# Patient Record
Sex: Female | Born: 1970 | Race: White | Hispanic: No | Marital: Married | Smoking: Never smoker
Health system: Southern US, Community
[De-identification: ages and names within clinical notes are randomized; demographics above are authoritative.]

## PROBLEM LIST (undated history)

## (undated) DIAGNOSIS — I471 Supraventricular tachycardia, unspecified: Secondary | ICD-10-CM

## (undated) DIAGNOSIS — R0602 Shortness of breath: Secondary | ICD-10-CM

## (undated) DIAGNOSIS — E669 Obesity, unspecified: Secondary | ICD-10-CM

## (undated) DIAGNOSIS — F32A Depression, unspecified: Secondary | ICD-10-CM

## (undated) DIAGNOSIS — R000 Tachycardia, unspecified: Secondary | ICD-10-CM

## (undated) DIAGNOSIS — E559 Vitamin D deficiency, unspecified: Secondary | ICD-10-CM

## (undated) DIAGNOSIS — R42 Dizziness and giddiness: Secondary | ICD-10-CM

## (undated) DIAGNOSIS — R002 Palpitations: Secondary | ICD-10-CM

## (undated) DIAGNOSIS — F329 Major depressive disorder, single episode, unspecified: Secondary | ICD-10-CM

## (undated) DIAGNOSIS — Z9889 Other specified postprocedural states: Secondary | ICD-10-CM

## (undated) DIAGNOSIS — C8590 Non-Hodgkin lymphoma, unspecified, unspecified site: Secondary | ICD-10-CM

## (undated) DIAGNOSIS — J45909 Unspecified asthma, uncomplicated: Secondary | ICD-10-CM

## (undated) DIAGNOSIS — R112 Nausea with vomiting, unspecified: Secondary | ICD-10-CM

## (undated) DIAGNOSIS — D649 Anemia, unspecified: Secondary | ICD-10-CM

## (undated) DIAGNOSIS — I456 Pre-excitation syndrome: Secondary | ICD-10-CM

## (undated) HISTORY — DX: Supraventricular tachycardia: I47.1

## (undated) HISTORY — DX: Tachycardia, unspecified: R00.0

## (undated) HISTORY — DX: Vitamin D deficiency, unspecified: E55.9

## (undated) HISTORY — DX: Palpitations: R00.2

## (undated) HISTORY — DX: Supraventricular tachycardia, unspecified: I47.10

## (undated) HISTORY — DX: Non-Hodgkin lymphoma, unspecified, unspecified site: C85.90

## (undated) HISTORY — DX: Major depressive disorder, single episode, unspecified: F32.9

## (undated) HISTORY — PX: TUMOR REMOVAL: SHX12

## (undated) HISTORY — DX: Dizziness and giddiness: R42

## (undated) HISTORY — DX: Obesity, unspecified: E66.9

## (undated) HISTORY — DX: Shortness of breath: R06.02

## (undated) HISTORY — DX: Depression, unspecified: F32.A

---

## 2008-06-23 ENCOUNTER — Encounter: Admission: RE | Admit: 2008-06-23 | Discharge: 2008-06-23 | Payer: Self-pay | Admitting: Obstetrics and Gynecology

## 2009-07-14 ENCOUNTER — Encounter: Admission: RE | Admit: 2009-07-14 | Discharge: 2009-07-14 | Payer: Self-pay | Admitting: Obstetrics and Gynecology

## 2012-12-18 ENCOUNTER — Other Ambulatory Visit: Payer: Self-pay

## 2012-12-18 DIAGNOSIS — Z1231 Encounter for screening mammogram for malignant neoplasm of breast: Secondary | ICD-10-CM

## 2013-01-14 ENCOUNTER — Ambulatory Visit
Admission: RE | Admit: 2013-01-14 | Discharge: 2013-01-14 | Disposition: A | Discharge status: Home / Self Care | Payer: Commercial Managed Care - PPO | Source: Ambulatory Visit

## 2013-01-14 DIAGNOSIS — Z1231 Encounter for screening mammogram for malignant neoplasm of breast: Secondary | ICD-10-CM

## 2013-12-11 ENCOUNTER — Other Ambulatory Visit: Payer: Self-pay

## 2013-12-11 DIAGNOSIS — Z1231 Encounter for screening mammogram for malignant neoplasm of breast: Secondary | ICD-10-CM

## 2014-01-15 DIAGNOSIS — J45909 Unspecified asthma, uncomplicated: Secondary | ICD-10-CM | POA: Insufficient documentation

## 2014-01-15 DIAGNOSIS — E559 Vitamin D deficiency, unspecified: Secondary | ICD-10-CM | POA: Insufficient documentation

## 2014-01-16 ENCOUNTER — Encounter (INDEPENDENT_AMBULATORY_CARE_PROVIDER_SITE_OTHER): Payer: Self-pay

## 2014-01-16 ENCOUNTER — Ambulatory Visit
Admission: RE | Admit: 2014-01-16 | Discharge: 2014-01-16 | Disposition: A | Discharge status: Home / Self Care | Payer: Commercial Managed Care - PPO | Source: Ambulatory Visit

## 2014-01-16 DIAGNOSIS — Z1231 Encounter for screening mammogram for malignant neoplasm of breast: Secondary | ICD-10-CM

## 2016-01-07 DIAGNOSIS — F4321 Adjustment disorder with depressed mood: Secondary | ICD-10-CM | POA: Insufficient documentation

## 2017-05-02 DIAGNOSIS — F419 Anxiety disorder, unspecified: Secondary | ICD-10-CM | POA: Insufficient documentation

## 2019-08-16 ENCOUNTER — Ambulatory Visit: Payer: Self-pay | Attending: Internal Medicine

## 2019-08-16 DIAGNOSIS — Z23 Encounter for immunization: Secondary | ICD-10-CM | POA: Insufficient documentation

## 2019-08-16 NOTE — Progress Notes (Signed)
   Covid-19 Vaccination Clinic  Name:  Lindsay Booth    MRN: [id number] DOB: May 09, 1971  08/16/2019  [name] was observed post Covid-19 immunization for 15 minutes without incidence. She was provided with Vaccine Information Sheet and instruction to access the V-Safe system.   [name] was instructed to call 911 with any severe reactions post vaccine: Marland Kitchen Difficulty breathing  . Swelling of your face and throat  . A fast heartbeat  . A bad rash all over your body  . Dizziness and weakness    Immunizations Administered    Name Date Dose VIS Date Route   Pfizer COVID-19 Vaccine 08/16/2019  5:58 PM 0.3 mL 05/30/2019 Intramuscular   Manufacturer: [employer], [employer]   Lot: [id number]   NDC: [address]-2

## 2019-09-06 ENCOUNTER — Ambulatory Visit: Payer: Self-pay | Attending: Internal Medicine

## 2019-09-06 DIAGNOSIS — Z23 Encounter for immunization: Secondary | ICD-10-CM

## 2019-09-06 NOTE — Progress Notes (Signed)
   Covid-19 Vaccination Clinic  Name:  Lindsay Booth    MRN: [id number] DOB: Feb 24, 1971  09/06/2019  [name] was observed post Covid-19 immunization for 15 minutes without incident. She was provided with Vaccine Information Sheet and instruction to access the V-Safe system.   [name] was instructed to call 911 with any severe reactions post vaccine: Marland Kitchen Difficulty breathing  . Swelling of face and throat  . A fast heartbeat  . A bad rash all over body  . Dizziness and weakness   Immunizations Administered    Name Date Dose VIS Date Route   Pfizer COVID-19 Vaccine 09/06/2019  2:09 PM 0.3 mL 05/30/2019 Intramuscular   Manufacturer: [employer], [employer]   Lot: [id number]   NDC: [address]-1

## 2020-03-30 ENCOUNTER — Other Ambulatory Visit: Payer: Self-pay | Admitting: Obstetrics and Gynecology

## 2020-03-30 DIAGNOSIS — R928 Other abnormal and inconclusive findings on diagnostic imaging of breast: Secondary | ICD-10-CM

## 2020-04-13 ENCOUNTER — Other Ambulatory Visit: Payer: Self-pay

## 2020-04-13 ENCOUNTER — Ambulatory Visit
Admission: RE | Admit: 2020-04-13 | Discharge: 2020-04-13 | Disposition: A | Discharge status: Home / Self Care | Payer: Commercial Managed Care - PPO | Source: Ambulatory Visit | Attending: Obstetrics and Gynecology | Admitting: Obstetrics and Gynecology

## 2020-04-13 ENCOUNTER — Other Ambulatory Visit: Payer: Self-pay | Admitting: Obstetrics and Gynecology

## 2020-04-13 DIAGNOSIS — R928 Other abnormal and inconclusive findings on diagnostic imaging of breast: Secondary | ICD-10-CM

## 2020-04-13 DIAGNOSIS — R921 Mammographic calcification found on diagnostic imaging of breast: Secondary | ICD-10-CM

## 2020-04-19 ENCOUNTER — Ambulatory Visit
Admission: RE | Admit: 2020-04-19 | Discharge: 2020-04-19 | Disposition: A | Discharge status: Home / Self Care | Payer: Commercial Managed Care - PPO | Source: Ambulatory Visit | Attending: Obstetrics and Gynecology | Admitting: Obstetrics and Gynecology

## 2020-04-19 ENCOUNTER — Other Ambulatory Visit: Payer: Self-pay

## 2020-04-19 DIAGNOSIS — R921 Mammographic calcification found on diagnostic imaging of breast: Secondary | ICD-10-CM

## 2020-04-19 HISTORY — PX: BREAST BIOPSY: SHX20

## 2020-04-26 ENCOUNTER — Ambulatory Visit: Payer: Self-pay | Admitting: Surgery

## 2020-04-26 DIAGNOSIS — N6091 Unspecified benign mammary dysplasia of right breast: Secondary | ICD-10-CM

## 2020-04-26 NOTE — H&P (Signed)
Lindsay Booth Appointment: 04/26/2020 2:20 PM [facility] Patient #: [id number] DOB: October 14, 1970 Unknown / Language: [place] / Race: White Female  History of Present Illness Lindsay Fus A. Lorenda Grecco MD; 04/26/2020 3:35 PM) Patient words: 49 year old female recalled from screening mammogram dated 03/22/2020 for right breast calcifications. Patient denies history of breast pain, breast mass or nipple discharge. She relates 3 first-degree relatives with breast cancer.  EXAM: DIGITAL DIAGNOSTIC RIGHT MAMMOGRAM WITH CAD  COMPARISON: Previous exam(s).  ACR Breast Density Category c: The breast tissue is heterogeneously dense, which may obscure small masses.  FINDINGS: Grouped, pleomorphic calcifications are demonstrated in the upper outer quadrant of the right breast at posterior depth. They measure 2.3 x 2.3 x 0.9 cm. They have an indeterminate morphology.  Mammographic images were processed with CAD.  IMPRESSION: Indeterminate right breast calcifications.  RECOMMENDATION: Stereotactic biopsy of the right breast.  I have discussed the findings and recommendations with the patient. If applicable, a reminder letter will be sent to the patient regarding the next appointment.  BI-RADS CATEGORY 4: Suspicious.   Electronically Signed By: Sande Brothers M.D. On: 04/13/2020 14:21       Diagnosis Breast, right, needle core biopsy, right upper outer - ATYPICAL LOBULAR HYPERPLASIA. 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  The patient is a 49 year old female.   Past Surgical History Micheal Likens, CMA; 04/26/2020 2:27 PM) Cesarean Section - Multiple  Diagnostic Studies History (Chanel Lonni Fix, CMA; 04/26/2020 2:27 PM) Colonoscopy never Mammogram within last year Pap Smear 1-5 years ago  Allergies Hedy Camara Lonni Fix, CMA; 04/26/2020 2:27 PM) No Known Drug Allergies [04/26/2020]: Allergies  Reconciled  Medication History (Chanel Lonni Fix, CMA; 04/26/2020 2:28 PM) Wellbutrin (Oral) Specific strength unknown - Active. Doxycycline Hyclate (Oral) Specific strength unknown - Active. Medications Reconciled  Social History Micheal Likens, CMA; 04/26/2020 2:27 PM) Alcohol use Occasional alcohol use. Caffeine use Coffee. No drug use Tobacco use Never smoker.  Family History Micheal Likens, CMA; 04/26/2020 2:27 PM) Breast Cancer Family Members In General. Depression Mother, Sister.  Pregnancy / Birth History Micheal Likens, CMA; 04/26/2020 2:27 PM) Age at menarche 15 years. Gravida 2 Length (months) of breastfeeding 3-6 Maternal age 72-30 Para 2 Regular periods  Other Problems (Chanel Lonni Fix, CMA; 04/26/2020 2:27 PM) Lump In Breast     Review of Systems (Chanel Nolan CMA; 04/26/2020 2:27 PM) General Not Present- Appetite Loss, Chills, Fatigue, Fever, Night Sweats, Weight Gain and Weight Loss. Skin Not Present- Change in Wart/Mole, Dryness, Hives, Jaundice, New Lesions, Non-Healing Wounds, Rash and Ulcer. HEENT Present- Seasonal Allergies. Not Present- Earache, Hearing Loss, Hoarseness, Nose Bleed, Oral Ulcers, Ringing in the Ears, Sinus Pain, Sore Throat, Visual Disturbances, Wears glasses/contact lenses and Yellow Eyes. Respiratory Not Present- Bloody sputum, Chronic Cough, Difficulty Breathing, Snoring and Wheezing. Breast Present- Breast Mass. Not Present- Breast Pain, Nipple Discharge and Skin Changes. Cardiovascular Not Present- Chest Pain, Difficulty Breathing Lying Down, Leg Cramps, Palpitations, Rapid Heart Rate, Shortness of Breath and Swelling of Extremities. Gastrointestinal Not Present- Abdominal Pain, Bloating, Bloody Stool, Change in Bowel Habits, Chronic diarrhea, Constipation, Difficulty Swallowing, Excessive gas, Gets full quickly at meals, Hemorrhoids, Indigestion, Nausea, Rectal Pain and Vomiting. Musculoskeletal Not Present- Back Pain, Joint Pain,  Joint Stiffness, Muscle Pain, Muscle Weakness and Swelling of Extremities. Neurological Not Present- Decreased Memory, Fainting, Headaches, Numbness, Seizures, Tingling, Tremor, Trouble walking and Weakness. Psychiatric Present- Depression. Not Present- Anxiety, Bipolar, Change in Sleep Pattern, Fearful and Frequent crying. Endocrine Not Present- Cold Intolerance, Excessive  Hunger, Hair Changes, Heat Intolerance, Hot flashes and New Diabetes. Hematology Not Present- Blood Thinners, Easy Bruising, Excessive bleeding, Gland problems, HIV and Persistent Infections.  Vitals (Chanel Nolan CMA; 04/26/2020 2:28 PM) 04/26/2020 2:28 PM Weight: 158.25 lb Height: 65.5in Body Surface Area: 1.8 m Body Mass Index: 25.93 kg/m  Temp.: 97.57F  Pulse: 71 (Regular)  BP: 124/76(Sitting, Left Arm, Standard)        Physical Exam (Larnie Heart A. Marnie Fazzino MD; 04/26/2020 3:35 PM)  General Mental Status-Alert. General Appearance-Consistent with stated age. Hydration-Well hydrated. Voice-Normal.  Head and Neck Head-normocephalic, atraumatic with no lesions or palpable masses. Trachea-midline. Thyroid Gland Characteristics - normal size and consistency.  Eye Eyeball - Bilateral-Extraocular movements intact. Sclera/Conjunctiva - Bilateral-No scleral icterus.  Chest and Lung Exam Chest and lung exam reveals -quiet, even and easy respiratory effort with no use of accessory muscles and on auscultation, normal breath sounds, no adventitious sounds and normal vocal resonance. Inspection Chest Wall - Normal. Back - normal.  Breast Breast - Left-Symmetric, Non Tender, No Biopsy scars, no Dimpling - Left, No Inflammation, No Lumpectomy scars, No Mastectomy scars, No Peau d' Orange. Breast - Right-Symmetric, Non Tender, No Biopsy scars, no Dimpling - Right, No Inflammation, No Lumpectomy scars, No Mastectomy scars, No Peau d' Orange. Breast Lump-No Palpable Breast  Mass.  Cardiovascular Cardiovascular examination reveals -normal heart sounds, regular rate and rhythm with no murmurs and normal pedal pulses bilaterally.  Abdomen Inspection Inspection of the abdomen reveals - No Hernias. Skin - Scar - no surgical scars. Palpation/Percussion Palpation and Percussion of the abdomen reveal - Soft, Non Tender, No Rebound tenderness, No Rigidity (guarding) and No hepatosplenomegaly. Auscultation Auscultation of the abdomen reveals - Bowel sounds normal.  Neurologic Neurologic evaluation reveals -alert and oriented x 3 with no impairment of recent or remote memory. Mental Status-Normal.  Musculoskeletal Normal Exam - Left-Upper Extremity Strength Normal and Lower Extremity Strength Normal. Normal Exam - Right-Upper Extremity Strength Normal and Lower Extremity Strength Normal.  Lymphatic Head & Neck  General Head & Neck Lymphatics: Bilateral - Description - Normal. Axillary  General Axillary Region: Bilateral - Description - Normal. Tenderness - Non Tender. Femoral & Inguinal  Generalized Femoral & Inguinal Lymphatics: Bilateral - Description - Normal. Tenderness - Non Tender.    Assessment & Plan (Cougar Imel A. 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 Pt agrees to proceed. localized lumpectomy due to potential upgrade risk 20%. Also discussed high risk management strategies well.  total time 45 minutes discussing treatment options, reviewing chart, examination, review pathology, surgical plan and documentation  Current Plans Pt Education - [facility] Free Text Education/Instructions: discussed with patient and provided information. Pt Education - [facility] Breast Biopsy HCI: discussed with  patient and provided information. You are being scheduled for surgery- Our schedulers will call you.  You should hear from our office's scheduling department within 5 working days about the location, date, and time of surgery. We try to make accommodations for patient's preferences in scheduling surgery, but sometimes the OR schedule or the surgeon's schedule prevents [place] from making those accommodations.  If you have not heard from our office [phone]) in 5 working days, call the office and ask for your surgeon's nurse.  If you have other questions about your diagnosis, plan, or surgery, call  the office and ask for your surgeon's nurse.

## 2020-05-18 ENCOUNTER — Other Ambulatory Visit: Payer: Self-pay | Admitting: Surgery

## 2020-05-18 DIAGNOSIS — N6091 Unspecified benign mammary dysplasia of right breast: Secondary | ICD-10-CM

## 2020-07-09 ENCOUNTER — Other Ambulatory Visit: Payer: Self-pay

## 2020-07-09 ENCOUNTER — Encounter (HOSPITAL_BASED_OUTPATIENT_CLINIC_OR_DEPARTMENT_OTHER): Payer: Self-pay | Admitting: Surgery

## 2020-07-09 NOTE — Progress Notes (Signed)
Chart reviewed with Dr. Mal Amabile, anesthesiologist, including history of Wolff-Parkinson-White syndrome. Okay to proceed with surgery as planned.

## 2020-07-12 ENCOUNTER — Other Ambulatory Visit (HOSPITAL_COMMUNITY)
Admission: RE | Admit: 2020-07-12 | Discharge: 2020-07-12 | Disposition: A | Discharge status: Home / Self Care | Payer: Commercial Managed Care - PPO | Source: Ambulatory Visit | Attending: Surgery | Admitting: Surgery

## 2020-07-12 ENCOUNTER — Encounter (HOSPITAL_BASED_OUTPATIENT_CLINIC_OR_DEPARTMENT_OTHER)
Admission: RE | Admit: 2020-07-12 | Discharge: 2020-07-12 | Disposition: A | Discharge status: Home / Self Care | Payer: Commercial Managed Care - PPO | Source: Ambulatory Visit | Attending: Surgery | Admitting: Surgery

## 2020-07-12 DIAGNOSIS — Z01812 Encounter for preprocedural laboratory examination: Secondary | ICD-10-CM | POA: Insufficient documentation

## 2020-07-12 DIAGNOSIS — Z20822 Contact with and (suspected) exposure to covid-19: Secondary | ICD-10-CM | POA: Insufficient documentation

## 2020-07-12 LAB — COMPREHENSIVE METABOLIC PANEL
ALT: 15 U/L (ref 0–44)
AST: 19 U/L (ref 15–41)
Albumin: 3.8 g/dL (ref 3.5–5.0)
Alkaline Phosphatase: 36 U/L — ABNORMAL LOW (ref 38–126)
Anion gap: 10 (ref 5–15)
BUN: 12 mg/dL (ref 6–20)
CO2: 27 mmol/L (ref 22–32)
Calcium: 9.2 mg/dL (ref 8.9–10.3)
Chloride: 101 mmol/L (ref 98–111)
Creatinine, Ser: 0.81 mg/dL (ref 0.44–1.00)
GFR, Estimated: >60 mL/min (ref >60)
Glucose, Bld: 89 mg/dL (ref 70–99)
Potassium: 4.5 mmol/L (ref 3.5–5.1)
Sodium: 138 mmol/L (ref 135–145)
Total Bilirubin: 0.5 mg/dL (ref 0.3–1.2)
Total Protein: 6.6 g/dL (ref 6.5–8.1)

## 2020-07-12 LAB — CBC WITH DIFFERENTIAL/PLATELET
Abs Immature Granulocytes: 0.01 10*3/uL (ref 0.00–0.07)
Basophils Absolute: 0.1 10*3/uL (ref 0.0–0.1)
Basophils Relative: 1 %
Eosinophils Absolute: 0.3 10*3/uL (ref 0.0–0.5)
Eosinophils Relative: 5 %
HCT: 34.3 % — ABNORMAL LOW (ref 36.0–46.0)
Hemoglobin: 11.4 g/dL — ABNORMAL LOW (ref 12.0–15.0)
Immature Granulocytes: 0 %
Lymphocytes Relative: 20 %
Lymphs Abs: 1.2 10*3/uL (ref 0.7–4.0)
MCH: 27.8 pg (ref 26.0–34.0)
MCHC: 33.2 g/dL (ref 30.0–36.0)
MCV: 83.7 fL (ref 80.0–100.0)
Monocytes Absolute: 0.4 10*3/uL (ref 0.1–1.0)
Monocytes Relative: 7 %
Neutro Abs: 4.1 10*3/uL (ref 1.7–7.7)
Neutrophils Relative %: 67 %
Platelets: 296 10*3/uL (ref 150–400)
RBC: 4.1 MIL/uL (ref 3.87–5.11)
RDW: 12.8 % (ref 11.5–15.5)
WBC: 6 10*3/uL (ref 4.0–10.5)
nRBC: 0 % (ref 0.0–0.2)

## 2020-07-12 LAB — SARS CORONAVIRUS 2 (TAT 6-24 HRS): SARS Coronavirus 2: NEGATIVE

## 2020-07-12 NOTE — Progress Notes (Signed)

## 2020-07-14 ENCOUNTER — Other Ambulatory Visit: Payer: Self-pay

## 2020-07-14 ENCOUNTER — Ambulatory Visit
Admission: RE | Admit: 2020-07-14 | Discharge: 2020-07-14 | Disposition: A | Discharge status: Home / Self Care | Payer: Commercial Managed Care - PPO | Source: Ambulatory Visit | Attending: Surgery | Admitting: Surgery

## 2020-07-14 DIAGNOSIS — N6091 Unspecified benign mammary dysplasia of right breast: Secondary | ICD-10-CM

## 2020-07-15 ENCOUNTER — Ambulatory Visit
Admission: RE | Admit: 2020-07-15 | Discharge: 2020-07-15 | Disposition: A | Discharge status: Home / Self Care | Payer: Commercial Managed Care - PPO | Source: Ambulatory Visit | Attending: Surgery | Admitting: Surgery

## 2020-07-15 ENCOUNTER — Ambulatory Visit (HOSPITAL_BASED_OUTPATIENT_CLINIC_OR_DEPARTMENT_OTHER): Payer: Commercial Managed Care - PPO | Admitting: Anesthesiology

## 2020-07-15 ENCOUNTER — Ambulatory Visit (HOSPITAL_BASED_OUTPATIENT_CLINIC_OR_DEPARTMENT_OTHER)
Admission: RE | Admit: 2020-07-15 | Discharge: 2020-07-15 | Disposition: A | Discharge status: Home / Self Care | Payer: Commercial Managed Care - PPO | Source: Ambulatory Visit | Attending: Surgery | Admitting: Surgery

## 2020-07-15 ENCOUNTER — Encounter (HOSPITAL_BASED_OUTPATIENT_CLINIC_OR_DEPARTMENT_OTHER)
Admission: RE | Disposition: A | Discharge status: Home / Self Care | Payer: Self-pay | Source: Ambulatory Visit | Attending: Surgery

## 2020-07-15 ENCOUNTER — Encounter (HOSPITAL_BASED_OUTPATIENT_CLINIC_OR_DEPARTMENT_OTHER): Payer: Self-pay | Admitting: Surgery

## 2020-07-15 DIAGNOSIS — N6489 Other specified disorders of breast: Secondary | ICD-10-CM | POA: Insufficient documentation

## 2020-07-15 DIAGNOSIS — Z803 Family history of malignant neoplasm of breast: Secondary | ICD-10-CM | POA: Diagnosis not present

## 2020-07-15 DIAGNOSIS — N6091 Unspecified benign mammary dysplasia of right breast: Secondary | ICD-10-CM | POA: Diagnosis present

## 2020-07-15 DIAGNOSIS — Z79899 Other long term (current) drug therapy: Secondary | ICD-10-CM | POA: Diagnosis not present

## 2020-07-15 HISTORY — DX: Other specified postprocedural states: Z98.890

## 2020-07-15 HISTORY — DX: Anemia, unspecified: D64.9

## 2020-07-15 HISTORY — DX: Unspecified asthma, uncomplicated: J45.909

## 2020-07-15 HISTORY — PX: BREAST LUMPECTOMY WITH RADIOACTIVE SEED LOCALIZATION: SHX6424

## 2020-07-15 HISTORY — DX: Pre-excitation syndrome: I45.6

## 2020-07-15 HISTORY — DX: Nausea with vomiting, unspecified: R11.2

## 2020-07-15 HISTORY — PX: BREAST EXCISIONAL BIOPSY: SUR124

## 2020-07-15 LAB — POCT PREGNANCY, URINE: Preg Test, Ur: NEGATIVE

## 2020-07-15 SURGERY — BREAST LUMPECTOMY WITH RADIOACTIVE SEED LOCALIZATION
Anesthesia: General | Site: Breast | Laterality: Right

## 2020-07-15 MED ORDER — CEFAZOLIN SODIUM-DEXTROSE 2-4 GM/100ML-% IV SOLN
INTRAVENOUS | Status: AC
  Filled 2020-07-15: qty 100

## 2020-07-15 MED ORDER — CELECOXIB 200 MG PO CAPS
ORAL_CAPSULE | ORAL | Status: AC
  Filled 2020-07-15: qty 1

## 2020-07-15 MED ORDER — PROPOFOL 500 MG/50ML IV EMUL
INTRAVENOUS | Status: AC
  Filled 2020-07-15: qty 50

## 2020-07-15 MED ORDER — FENTANYL CITRATE (PF) 100 MCG/2ML IJ SOLN
INTRAMUSCULAR | Status: DC | PRN
  Administered 2020-07-15: 50 ug via INTRAVENOUS

## 2020-07-15 MED ORDER — MEPERIDINE HCL 25 MG/ML IJ SOLN: 6.2500 mg | INTRAMUSCULAR | Status: DC | PRN

## 2020-07-15 MED ORDER — DROPERIDOL 2.5 MG/ML IJ SOLN
INTRAMUSCULAR | Status: AC
  Filled 2020-07-15: qty 2

## 2020-07-15 MED ORDER — CHLORHEXIDINE GLUCONATE CLOTH 2 % EX PADS: 6.0000 | MEDICATED_PAD | Freq: Once | CUTANEOUS | Status: DC

## 2020-07-15 MED ORDER — CEFAZOLIN SODIUM-DEXTROSE 2-4 GM/100ML-% IV SOLN: 2.0000 g | INTRAVENOUS | Status: DC

## 2020-07-15 MED ORDER — PROPOFOL 10 MG/ML IV BOLUS
INTRAVENOUS | Status: DC | PRN
  Administered 2020-07-15: 200 mg via INTRAVENOUS

## 2020-07-15 MED ORDER — BUPIVACAINE HCL (PF) 0.25 % IJ SOLN
INTRAMUSCULAR | Status: AC
  Filled 2020-07-15: qty 120

## 2020-07-15 MED ORDER — DEXAMETHASONE SODIUM PHOSPHATE 4 MG/ML IJ SOLN
INTRAMUSCULAR | Status: DC | PRN
  Administered 2020-07-15: 10 mg via INTRAVENOUS

## 2020-07-15 MED ORDER — HYDROCODONE-ACETAMINOPHEN 5-325 MG PO TABS: 1.0000 | ORAL_TABLET | Freq: Four times a day (QID) | ORAL | 0 refills | Status: DC | PRN

## 2020-07-15 MED ORDER — DEXAMETHASONE SODIUM PHOSPHATE 10 MG/ML IJ SOLN
INTRAMUSCULAR | Status: AC
  Filled 2020-07-15: qty 1

## 2020-07-15 MED ORDER — PROMETHAZINE HCL 25 MG/ML IJ SOLN: 6.2500 mg | INTRAMUSCULAR | Status: DC | PRN

## 2020-07-15 MED ORDER — ACETAMINOPHEN 500 MG PO TABS
ORAL_TABLET | ORAL | Status: AC
  Filled 2020-07-15: qty 2

## 2020-07-15 MED ORDER — DROPERIDOL 2.5 MG/ML IJ SOLN
INTRAMUSCULAR | Status: DC | PRN
  Administered 2020-07-15: .625 mg via INTRAVENOUS

## 2020-07-15 MED ORDER — ONDANSETRON HCL 4 MG/2ML IJ SOLN
INTRAMUSCULAR | Status: DC | PRN
  Administered 2020-07-15: 4 mg via INTRAVENOUS

## 2020-07-15 MED ORDER — BUPIVACAINE HCL 0.25 % IJ SOLN
INTRAMUSCULAR | Status: DC | PRN
  Administered 2020-07-15: 16 mL

## 2020-07-15 MED ORDER — LACTATED RINGERS IV SOLN: INTRAVENOUS | Status: DC

## 2020-07-15 MED ORDER — GABAPENTIN 300 MG PO CAPS
ORAL_CAPSULE | ORAL | Status: AC
  Filled 2020-07-15: qty 1

## 2020-07-15 MED ORDER — LIDOCAINE HCL (CARDIAC) PF 100 MG/5ML IV SOSY
PREFILLED_SYRINGE | INTRAVENOUS | Status: DC | PRN
  Administered 2020-07-15: 60 mg via INTRAVENOUS

## 2020-07-15 MED ORDER — ONDANSETRON HCL 4 MG/2ML IJ SOLN
INTRAMUSCULAR | Status: AC
  Filled 2020-07-15: qty 2

## 2020-07-15 MED ORDER — OXYCODONE HCL 5 MG PO TABS: 5.0000 mg | ORAL_TABLET | Freq: Once | ORAL | Status: DC | PRN

## 2020-07-15 MED ORDER — MIDAZOLAM HCL 2 MG/2ML IJ SOLN
INTRAMUSCULAR | Status: AC
  Filled 2020-07-15: qty 2

## 2020-07-15 MED ORDER — LIDOCAINE 2% (20 MG/ML) 5 ML SYRINGE
INTRAMUSCULAR | Status: AC
  Filled 2020-07-15: qty 5

## 2020-07-15 MED ORDER — OXYCODONE HCL 5 MG/5ML PO SOLN: 5.0000 mg | Freq: Once | ORAL | Status: DC | PRN

## 2020-07-15 MED ORDER — MIDAZOLAM HCL 5 MG/5ML IJ SOLN
INTRAMUSCULAR | Status: DC | PRN
  Administered 2020-07-15: 2 mg via INTRAVENOUS

## 2020-07-15 MED ORDER — CELECOXIB 200 MG PO CAPS
200.0000 mg | ORAL_CAPSULE | ORAL | Status: AC
  Administered 2020-07-15: 200 mg via ORAL

## 2020-07-15 MED ORDER — 0.9 % SODIUM CHLORIDE (POUR BTL) OPTIME
TOPICAL | Status: DC | PRN
  Administered 2020-07-15: 1000 mL

## 2020-07-15 MED ORDER — HYDROMORPHONE HCL 1 MG/ML IJ SOLN: 0.2500 mg | INTRAMUSCULAR | Status: DC | PRN

## 2020-07-15 MED ORDER — FENTANYL CITRATE (PF) 100 MCG/2ML IJ SOLN
INTRAMUSCULAR | Status: AC
  Filled 2020-07-15: qty 2

## 2020-07-15 MED ORDER — ACETAMINOPHEN 500 MG PO TABS
1000.0000 mg | ORAL_TABLET | ORAL | Status: AC
  Administered 2020-07-15: 1000 mg via ORAL

## 2020-07-15 MED ORDER — IBUPROFEN 800 MG PO TABS: 800.0000 mg | ORAL_TABLET | Freq: Three times a day (TID) | ORAL | 0 refills | Status: AC | PRN

## 2020-07-15 MED ORDER — GABAPENTIN 300 MG PO CAPS
300.0000 mg | ORAL_CAPSULE | ORAL | Status: AC
  Administered 2020-07-15: 300 mg via ORAL

## 2020-07-15 SURGICAL SUPPLY — 51 items
ADH SKN CLS APL DERMABOND .7 (GAUZE/BANDAGES/DRESSINGS) ×1
APL PRP STRL LF DISP 70% ISPRP (MISCELLANEOUS) ×1
APPLIER CLIP 9.375 MED OPEN (MISCELLANEOUS)
APR CLP MED 9.3 20 MLT OPN (MISCELLANEOUS)
BINDER BREAST LRG (GAUZE/BANDAGES/DRESSINGS) ×2 IMPLANT
BINDER BREAST MEDIUM (GAUZE/BANDAGES/DRESSINGS) IMPLANT
BINDER BREAST XLRG (GAUZE/BANDAGES/DRESSINGS) IMPLANT
BINDER BREAST XXLRG (GAUZE/BANDAGES/DRESSINGS) IMPLANT
BLADE SURG 15 STRL LF DISP TIS (BLADE) ×1 IMPLANT
BLADE SURG 15 STRL SS (BLADE) ×2
CANISTER SUC SOCK COL 7IN (MISCELLANEOUS) IMPLANT
CANISTER SUCT 1200ML W/VALVE (MISCELLANEOUS) IMPLANT
CHLORAPREP W/TINT 26 (MISCELLANEOUS) ×2 IMPLANT
CLIP APPLIE 9.375 MED OPEN (MISCELLANEOUS) IMPLANT
COVER BACK TABLE 60X90IN (DRAPES) ×2 IMPLANT
COVER MAYO STAND STRL (DRAPES) ×2 IMPLANT
COVER PROBE W GEL 5X96 (DRAPES) ×2 IMPLANT
COVER WAND RF STERILE (DRAPES) IMPLANT
DECANTER SPIKE VIAL GLASS SM (MISCELLANEOUS) IMPLANT
DERMABOND ADVANCED (GAUZE/BANDAGES/DRESSINGS) ×1
DERMABOND ADVANCED .7 DNX12 (GAUZE/BANDAGES/DRESSINGS) ×1 IMPLANT
DRAPE LAPAROSCOPIC ABDOMINAL (DRAPES) IMPLANT
DRAPE LAPAROTOMY 100X72 PEDS (DRAPES) ×2 IMPLANT
DRAPE UTILITY XL STRL (DRAPES) ×2 IMPLANT
ELECT COATED BLADE 2.86 ST (ELECTRODE) ×2 IMPLANT
ELECT REM PT RETURN 9FT ADLT (ELECTROSURGICAL) ×2
ELECTRODE REM PT RTRN 9FT ADLT (ELECTROSURGICAL) ×1 IMPLANT
GLOVE ECLIPSE 8.0 STRL XLNG CF (GLOVE) ×2 IMPLANT
GLOVE SRG 8 PF TXTR STRL LF DI (GLOVE) ×1 IMPLANT
GLOVE SURG UNDER POLY LF SZ8 (GLOVE) ×2
GOWN STRL REUS W/ TWL LRG LVL3 (GOWN DISPOSABLE) ×2 IMPLANT
GOWN STRL REUS W/ TWL XL LVL3 (GOWN DISPOSABLE) ×1 IMPLANT
GOWN STRL REUS W/TWL LRG LVL3 (GOWN DISPOSABLE) ×4
GOWN STRL REUS W/TWL XL LVL3 (GOWN DISPOSABLE) ×2
HEMOSTAT ARISTA ABSORB 3G PWDR (HEMOSTASIS) IMPLANT
HEMOSTAT SNOW SURGICEL 2X4 (HEMOSTASIS) IMPLANT
KIT MARKER MARGIN INK (KITS) ×2 IMPLANT
NEEDLE HYPO 25X1 1.5 SAFETY (NEEDLE) ×2 IMPLANT
NS IRRIG 1000ML POUR BTL (IV SOLUTION) ×2 IMPLANT
PACK BASIN DAY SURGERY FS (CUSTOM PROCEDURE TRAY) ×2 IMPLANT
PENCIL SMOKE EVACUATOR (MISCELLANEOUS) ×2 IMPLANT
SLEEVE SCD COMPRESS KNEE MED (MISCELLANEOUS) ×2 IMPLANT
SPONGE LAP 4X18 RFD (DISPOSABLE) ×2 IMPLANT
SUT MNCRL AB 4-0 PS2 18 (SUTURE) ×2 IMPLANT
SUT SILK 2 0 SH (SUTURE) IMPLANT
SUT VICRYL 3-0 CR8 SH (SUTURE) ×2 IMPLANT
SYR CONTROL 10ML LL (SYRINGE) ×2 IMPLANT
TOWEL GREEN STERILE FF (TOWEL DISPOSABLE) ×2 IMPLANT
TRAY FAXITRON CT DISP (TRAY / TRAY PROCEDURE) ×2 IMPLANT
TUBE CONNECTING 20X1/4 (TUBING) IMPLANT
YANKAUER SUCT BULB TIP NO VENT (SUCTIONS) IMPLANT

## 2020-07-15 NOTE — Anesthesia Postprocedure Evaluation (Signed)
Anesthesia Post Note  Patient: Lindsay Booth  Procedure(s) Performed: RADIOACTIVE SEED GUIDED RIGHT BREAST LUMPECTOMY (Right Breast)     Patient location during evaluation: PACU Anesthesia Type: General Level of consciousness: awake and alert Pain management: pain level controlled Vital Signs Assessment: post-procedure vital signs reviewed and stable Respiratory status: spontaneous breathing, nonlabored ventilation and respiratory function stable Cardiovascular status: blood pressure returned to baseline and stable Postop Assessment: no apparent nausea or vomiting Anesthetic complications: no   No complications documented.  Last Vitals:  Vitals:   07/15/20 0845 07/15/20 0911  BP: 100/61 98/73  Pulse: (!) 58 64  Resp: 11 18  Temp:  36.7 C  SpO2: 100% 100%    Last Pain:  Vitals:   07/15/20 0911  TempSrc:   PainSc: 2                  Lowella Curb

## 2020-07-15 NOTE — Transfer of Care (Signed)
Immediate Anesthesia Transfer of Care Note  Patient: Lindsay Booth  Procedure(s) Performed: RADIOACTIVE SEED GUIDED RIGHT BREAST LUMPECTOMY (Right Breast)  Patient Location: PACU  Anesthesia Type:General  Level of Consciousness: sedated  Airway & Oxygen Therapy: Patient Spontanous Breathing and Patient connected to face mask oxygen  Post-op Assessment: Report given to RN and Post -op Vital signs reviewed and stable  Post vital signs: Reviewed and stable  Last Vitals:  Vitals Value Taken Time  BP 87/55 07/15/20 0815  Temp    Pulse 59 07/15/20 0817  Resp 10 07/15/20 0817  SpO2 100 % 07/15/20 0817  Vitals shown include unvalidated device data.  Last Pain:  Vitals:   07/15/20 0634  TempSrc: Oral  PainSc: 0-No pain         Complications: No complications documented.

## 2020-07-15 NOTE — Interval H&P Note (Signed)
History and Physical Interval Note:  07/15/2020 7:26 AM  Lindsay Booth  has presented today for surgery, with the diagnosis of RIGHT BREAST ATYPICAL LOBULAR HYPERPLASIA.  The various methods of treatment have been discussed with the patient and family. After consideration of risks, benefits and other options for treatment, the patient has consented to  Procedure(s): RADIOACTIVE SEED GUIDED RIGHT BREAST LUMPECTOMY (Right) as a surgical intervention.  The patient's history has been reviewed, patient examined, no change in status, stable for surgery.  I have reviewed the patient's chart and labs.  Questions were answered to the patient's satisfaction.     Artisha Capri A Kaydie Petsch

## 2020-07-15 NOTE — H&P (Signed)
Lindsay Booth  Location: [facility] Patient #: [id number] DOB: 07-08-1970 Unknown / Language: [place] / Race: White Female  History of Present Illness ( 3:35 PM) Patient words: 50 year old female recalled from screening mammogram dated 03/22/2020 for right breast calcifications. Patient denies history of breast pain, breast mass or nipple discharge. She relates 3 first-degree relatives with breast cancer.  EXAM: DIGITAL DIAGNOSTIC RIGHT MAMMOGRAM WITH CAD  COMPARISON: Previous exam(s).  ACR Breast Density Category c: The breast tissue is heterogeneously dense, which may obscure small masses.  FINDINGS: Grouped, pleomorphic calcifications are demonstrated in the upper outer quadrant of the right breast at posterior depth. They measure 2.3 x 2.3 x 0.9 cm. They have an indeterminate morphology.  Mammographic images were processed with CAD.  IMPRESSION: Indeterminate right breast calcifications.  RECOMMENDATION: Stereotactic biopsy of the right breast.  I have discussed the findings and recommendations with the patient. If applicable, a reminder letter will be sent to the patient regarding the next appointment.  BI-RADS CATEGORY 4: Suspicious.   Electronically Signed By: Sande Brothers M.D. On: 04/13/2020 14:21       Diagnosis Breast, right, needle core biopsy, right upper outer - ATYPICAL LOBULAR HYPERPLASIA. 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  The patient is a 50 year old female.   Past Surgical History  Cesarean Section - Multiple  Diagnostic Studies History PM) Colonoscopy never Mammogram within last year Pap Smear 1-5 years ago  Allergies (No Known Drug Allergies [04/26/2020]: Allergies Reconciled  Medication History ( Wellbutrin (Oral) Specific strength unknown - Active. Doxycycline Hyclate (Oral) Specific strength unknown -  Active. Medications Reconciled  Social History  Alcohol use Occasional alcohol use. Caffeine use Coffee. No drug use Tobacco use Never smoker.  Family History  Breast Cancer Family Members In General. Depression Mother, Sister.  Pregnancy / Birth History ( PM) Age at menarche 15 years. Gravida 2 Length (months) of breastfeeding 3-6 Maternal age 59-30 Para 2 Regular periods  Other Problems) Lump In Breast     Review of Systems (Chanel Nolan CMA; 04/26/2020 2:27 PM) General Not Present- Appetite Loss, Chills, Fatigue, Fever, Night Sweats, Weight Gain and Weight Loss. Skin Not Present- Change in Wart/Mole, Dryness, Hives, Jaundice, New Lesions, Non-Healing Wounds, Rash and Ulcer. HEENT Present- Seasonal Allergies. Not Present- Earache, Hearing Loss, Hoarseness, Nose Bleed, Oral Ulcers, Ringing in the Ears, Sinus Pain, Sore Throat, Visual Disturbances, Wears glasses/contact lenses and Yellow Eyes. Respiratory Not Present- Bloody sputum, Chronic Cough, Difficulty Breathing, Snoring and Wheezing. Breast Present- Breast Mass. Not Present- Breast Pain, Nipple Discharge and Skin Changes. Cardiovascular Not Present- Chest Pain, Difficulty Breathing Lying Down, Leg Cramps, Palpitations, Rapid Heart Rate, Shortness of Breath and Swelling of Extremities. Gastrointestinal Not Present- Abdominal Pain, Bloating, Bloody Stool, Change in Bowel Habits, Chronic diarrhea, Constipation, Difficulty Swallowing, Excessive gas, Gets full quickly at meals, Hemorrhoids, Indigestion, Nausea, Rectal Pain and Vomiting. Musculoskeletal Not Present- Back Pain, Joint Pain, Joint Stiffness, Muscle Pain, Muscle Weakness and Swelling of Extremities. Neurological Not Present- Decreased Memory, Fainting, Headaches, Numbness, Seizures, Tingling, Tremor, Trouble walking and Weakness. Psychiatric Present- Depression. Not Present- Anxiety, Bipolar, Change in Sleep Pattern, Fearful and Frequent  crying. Endocrine Not Present- Cold Intolerance, Excessive Hunger, Hair Changes, Heat Intolerance, Hot flashes and New Diabetes. Hematology Not Present- Blood Thinners, Easy Bruising, Excessive bleeding, Gland problems, HIV and Persistent Infections.  Vitals 04/26/2020 2:28 PM Weight: 158.25 lb Height: 65.5in Body Surface Area: 1.8 m Body Mass Index: 25.93 kg/m  Temp.: 97.81F  Pulse: 71 (Regular)  BP: 124/76(Sitting, Left Arm, Standard)        Physical Exam   General Mental Status-Alert. General Appearance-Consistent with stated age. Hydration-Well hydrated. Voice-Normal.  Head and Neck Head-normocephalic, atraumatic with no lesions or palpable masses. Trachea-midline. Thyroid Gland Characteristics - normal size and consistency.  Eye Eyeball - Bilateral-Extraocular movements intact. Sclera/Conjunctiva - Bilateral-No scleral icterus.  Chest and Lung Exam Chest and lung exam reveals -quiet, even and easy respiratory effort with no use of accessory muscles and on auscultation, normal breath sounds, no adventitious sounds and normal vocal resonance. Inspection Chest Wall - Normal. Back - normal.  Breast Breast - Left-Symmetric, Non Tender, No Biopsy scars, no Dimpling - Left, No Inflammation, No Lumpectomy scars, No Mastectomy scars, No Peau d' Orange. Breast - Right-Symmetric, Non Tender, No Biopsy scars, no Dimpling - Right, No Inflammation, No Lumpectomy scars, No Mastectomy scars, No Peau d' Orange. Breast Lump-No Palpable Breast Mass.  Cardiovascular Cardiovascular examination reveals -normal heart sounds, regular rate and rhythm with no murmurs and normal pedal pulses bilaterally.  Abdomen Inspection Inspection of the abdomen reveals - No Hernias. Skin - Scar - no surgical scars. Palpation/Percussion Palpation and Percussion of the abdomen reveal - Soft, Non Tender, No Rebound tenderness, No Rigidity  (guarding) and No hepatosplenomegaly. Auscultation Auscultation of the abdomen reveals - Bowel sounds normal.  Neurologic Neurologic evaluation reveals -alert and oriented x 3 with no impairment of recent or remote memory. Mental Status-Normal.  Musculoskeletal Normal Exam - Left-Upper Extremity Strength Normal and Lower Extremity Strength Normal. Normal Exam - Right-Upper Extremity Strength Normal and Lower Extremity Strength Normal.  Lymphatic Head & Neck  General Head & Neck Lymphatics: Bilateral - Description - Normal. Axillary  General Axillary Region: Bilateral - Description - Normal. Tenderness - Non Tender. Femoral & Inguinal  Generalized Femoral & Inguinal Lymphatics: Bilateral - Description - Normal. Tenderness - Non Tender.    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 Pt agrees to proceed. localized lumpectomy due to potential upgrade risk 20%. Also discussed high risk management strategies well.  total time 45 minutes discussing treatment options, reviewing chart, examination, review pathology, surgical plan and documentation  Current Plans Pt Education - [facility] Free Text Education/Instructions: discussed with patient and provided information. Pt Education - [facility] Breast Biopsy HCI: discussed with patient and provided information. You are being scheduled for surgery- Our schedulers will call you.  You should hear from our office's scheduling department within 5 working days about the location, date, and time of surgery. We try to make accommodations for patient's preferences in scheduling surgery, but sometimes the OR schedule or the surgeon's schedule prevents [place] from making those  accommodations.  If you have not heard from our office [phone]) in 5 working days, call the office and ask for your surgeon's nurse.  If you have other questions about your diagnosis, plan, or surgery, call the office and ask for your surgeon's nurse.

## 2020-07-15 NOTE — Anesthesia Preprocedure Evaluation (Signed)
Anesthesia Evaluation  Patient identified by MRN, date of birth, ID band Patient awake    Reviewed: Allergy & Precautions, H&P , NPO status , Patient's Chart, lab work & pertinent test results  History of Anesthesia Complications (+) PONV  Airway Mallampati: II  TM Distance: >3 FB Neck ROM: Full    Dental no notable dental hx.    Pulmonary asthma ,    Pulmonary exam normal breath sounds clear to auscultation       Cardiovascular Normal cardiovascular exam Rhythm:Regular Rate:Normal     Neuro/Psych Depression negative neurological ROS  negative psych ROS   GI/Hepatic negative GI ROS, Neg liver ROS,   Endo/Other  negative endocrine ROS  Renal/GU negative Renal ROS  negative genitourinary   Musculoskeletal negative musculoskeletal ROS (+)   Abdominal   Peds negative pediatric ROS (+)  Hematology negative hematology ROS (+)   Anesthesia Other Findings   Reproductive/Obstetrics negative OB ROS                             Anesthesia Physical Anesthesia Plan  ASA: II  Anesthesia Plan: General   Post-op Pain Management:    Induction: Intravenous  PONV Risk Score and Plan: 4 or greater and Ondansetron, Dexamethasone, Midazolam, Droperidol and Treatment may vary due to age or medical condition  Airway Management Planned: LMA  Additional Equipment:   Intra-op Plan:   Post-operative Plan: Extubation in OR  Informed Consent: I have reviewed the patients History and Physical, chart, labs and discussed the procedure including the risks, benefits and alternatives for the proposed anesthesia with the patient or authorized representative who has indicated his/her understanding and acceptance.     Dental advisory given  Plan Discussed with: CRNA  Anesthesia Plan Comments:         Anesthesia Quick Evaluation

## 2020-07-15 NOTE — Anesthesia Procedure Notes (Signed)
Procedure Name: LMA Insertion Date/Time: 07/15/2020 7:35 AM Performed by: Burna Cash, CRNA Pre-anesthesia Checklist: Patient identified, Emergency Drugs available, Suction available and Patient being monitored Patient Re-evaluated:Patient Re-evaluated prior to induction Oxygen Delivery Method: Circle system utilized Preoxygenation: Pre-oxygenation with 100% oxygen Induction Type: IV induction Ventilation: Mask ventilation without difficulty LMA: LMA inserted LMA Size: 4.0 Number of attempts: 1 Airway Equipment and Method: Bite block Placement Confirmation: positive ETCO2 Tube secured with: Tape Dental Injury: Teeth and Oropharynx as per pre-operative assessment

## 2020-07-15 NOTE — Discharge Instructions (Signed)
[facility] °Office Phone Number [phone] ° °BREAST BIOPSY/ PARTIAL MASTECTOMY: POST OP INSTRUCTIONS ° °Always review your discharge instruction sheet given to you by the facility where your surgery was performed. ° °IF YOU HAVE DISABILITY OR FAMILY LEAVE FORMS, YOU MUST BRING THEM TO THE OFFICE FOR PROCESSING.  DO NOT GIVE THEM TO YOUR DOCTOR. ° °1. A prescription for pain medication may be given to you upon discharge.  Take your pain medication as prescribed, if needed.  If narcotic pain medicine is not needed, then you may take acetaminophen (Tylenol) or ibuprofen (Advil) as needed. °2. Take your usually prescribed medications unless otherwise directed °3. If you need a refill on your pain medication, please contact your pharmacy.  They will contact our office to request authorization.  Prescriptions will not be filled after 5pm or on week-ends. °4. You should eat very light the first 24 hours after surgery, such as soup, crackers, pudding, etc.  Resume your normal diet the day after surgery. °5. Most patients will experience some swelling and bruising in the breast.  Ice packs and a good support bra will help.  Swelling and bruising can take several days to resolve.  °6. It is common to experience some constipation if taking pain medication after surgery.  Increasing fluid intake and taking a stool softener will usually help or prevent this problem from occurring.  A mild laxative (Milk of Magnesia or Miralax) should be taken according to package directions if there are no bowel movements after 48 hours. °7. Unless discharge instructions indicate otherwise, you may remove your bandages 24-48 hours after surgery, and you may shower at that time.  You may have steri-strips (small skin tapes) in place directly over the incision.  These strips should be left on the skin for 7-10 days.  If your surgeon used skin glue on the incision, you may shower in 24 hours.  The glue will flake off over the  next 2-3 weeks.  Any sutures or staples will be removed at the office during your follow-up visit. °8. ACTIVITIES:  You may resume regular daily activities (gradually increasing) beginning the next day.  Wearing a good support bra or sports bra minimizes pain and swelling.  You may have sexual intercourse when it is comfortable. °a. You may drive when you no longer are taking prescription pain medication, you can comfortably wear a seatbelt, and you can safely maneuver your car and apply brakes. °b. RETURN TO WORK:  ______________________________________________________________________________________ °9. You should see your doctor in the office for a follow-up appointment approximately two weeks after your surgery.  Your doctor’s nurse will typically make your follow-up appointment when she calls you with your pathology report.  Expect your pathology report 2-3 business days after your surgery.  You may call to check if you do not hear from us after three days. °10. OTHER INSTRUCTIONS: _______________________________________________________________________________________________ _____________________________________________________________________________________________________________________________________ °_____________________________________________________________________________________________________________________________________ °_____________________________________________________________________________________________________________________________________ ° °WHEN TO CALL YOUR DOCTOR: °1. Fever over 101.0 °2. Nausea and/or vomiting. °3. Extreme swelling or bruising. °4. Continued bleeding from incision. °5. Increased pain, redness, or drainage from the incision. ° °The clinic staff is available to answer your questions during regular business hours.  Please don’t hesitate to call and ask to speak to one of the nurses for clinical concerns.  If you have a medical emergency, go to the nearest  emergency room or call 911.  A surgeon from [facility] is always on call at the hospital. ° °For further questions, please visit [email or web address]  ° ° °  NO TYLENOL OR IBUPROFEN BEFORE 12:40PM TODAY, IF NEEDED.  Post Anesthesia Home Care Instructions  Activity: Get plenty of rest for the remainder of the day. A responsible individual must stay with you for 24 hours following the procedure.  For the next 24 hours, DO NOT: -Drive a car -Advertising copywriter -Drink alcoholic beverages -Take any medication unless instructed by your physician -Make any legal decisions or sign important papers.  Meals: Start with liquid foods such as gelatin or soup. Progress to regular foods as tolerated. Avoid greasy, spicy, heavy foods. If nausea and/or vomiting occur, drink only clear liquids until the nausea and/or vomiting subsides. Call your physician if vomiting continues.  Special Instructions/Symptoms: Your throat may feel dry or sore from the anesthesia or the breathing tube placed in your throat during surgery. If this causes discomfort, gargle with warm salt water. The discomfort should disappear within 24 hours.  If you had a scopolamine patch placed behind your ear for the management of post- operative nausea and/or vomiting:  1. The medication in the patch is effective for 72 hours, after which it should be removed.  Wrap patch in a tissue and discard in the trash. Wash hands thoroughly with soap and water. 2. You may remove the patch earlier than 72 hours if you experience unpleasant side effects which may include dry mouth, dizziness or visual disturbances. 3. Avoid touching the patch. Wash your hands with soap and water after contact with the patch.

## 2020-07-15 NOTE — Op Note (Signed)
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  Anesthesia: Gen. With 0.25% Sensorcaine local  EBL: 20 cc  Specimen: right breast  tissue with clip and radioactive seed in the specimen. Verified with neoprobe and radiographic image showing both seed and clip in specimen  Indications for procedure: The patient presents for right breast lumpectomy after core biopsy showed atypical ductal hyperplasia . Discussed the rationale for considering removal. Small risk of malignancy associated with  lesion after core biopsy. Discussed observation. Discussed wire/seed  localization. Patient desired removal .The procedure has been discussed with the patient. Alternatives to surgery have been discussed with the patient.  Risks of surgery include bleeding,  Infection,  Seroma formation, death,  and the need for further surgery.   The patient understands and wishes to proceed.   Description of procedure: Patient underwent seed placement as an outpatient. Patient presents today for right  breast seed localized lumpectomy. Patient seen in the holding area. Questions are answered and neoprobe used to verify seed location. Patient taken back to the operating room and placed upon the OR table. After induction of general anesthesia, right  breast prepped and draped in a sterile fashion. Timeout was done to verify proper  procedure. Neoprobe used and hot spot identified and right  breast upper-outer quadrant. This was marked with pen. Curvilinear incision made right  upper outer quadrant breast. Dissection used with the help of a neoprobe around the tissue where the seed and clip were located. Tissue removed in its entirety with gross negative  margins.. Neoprobe used and seed within specimen. Radiographs taken which show clip and seed  In specimen.Hemostasis achieved and cavity closed with  3-0 Vicryl and 4-0 Monocryl. Dermabond applied. All final counts found to be correct. Specimen transported to pathology. Patient awoke extubated taken to recovery in satisfactory condition.

## 2020-07-16 ENCOUNTER — Encounter (HOSPITAL_BASED_OUTPATIENT_CLINIC_OR_DEPARTMENT_OTHER): Payer: Self-pay | Admitting: Surgery

## 2020-07-19 LAB — SURGICAL PATHOLOGY

## 2020-12-07 DIAGNOSIS — Z8742 Personal history of other diseases of the female genital tract: Secondary | ICD-10-CM | POA: Insufficient documentation

## 2021-01-06 ENCOUNTER — Other Ambulatory Visit: Payer: Self-pay | Admitting: Obstetrics and Gynecology

## 2021-01-06 DIAGNOSIS — Z1231 Encounter for screening mammogram for malignant neoplasm of breast: Secondary | ICD-10-CM

## 2021-03-23 ENCOUNTER — Other Ambulatory Visit: Payer: Self-pay

## 2021-03-23 ENCOUNTER — Ambulatory Visit
Admission: RE | Admit: 2021-03-23 | Discharge: 2021-03-23 | Disposition: A | Discharge status: Home / Self Care | Payer: Commercial Managed Care - PPO | Source: Ambulatory Visit | Attending: Obstetrics and Gynecology | Admitting: Obstetrics and Gynecology

## 2021-03-23 DIAGNOSIS — Z1231 Encounter for screening mammogram for malignant neoplasm of breast: Secondary | ICD-10-CM

## 2021-10-11 ENCOUNTER — Encounter (HOSPITAL_COMMUNITY): Payer: Self-pay

## 2021-12-16 LAB — HM COLONOSCOPY

## 2022-01-06 ENCOUNTER — Other Ambulatory Visit: Payer: Self-pay | Admitting: Obstetrics and Gynecology

## 2022-01-06 DIAGNOSIS — Z1231 Encounter for screening mammogram for malignant neoplasm of breast: Secondary | ICD-10-CM

## 2022-03-24 ENCOUNTER — Ambulatory Visit
Admission: RE | Admit: 2022-03-24 | Discharge: 2022-03-24 | Disposition: A | Discharge status: Home / Self Care | Payer: Commercial Managed Care - PPO | Source: Ambulatory Visit | Attending: Obstetrics and Gynecology | Admitting: Obstetrics and Gynecology

## 2022-03-24 DIAGNOSIS — Z1231 Encounter for screening mammogram for malignant neoplasm of breast: Secondary | ICD-10-CM

## 2022-06-22 ENCOUNTER — Ambulatory Visit
Admission: RE | Admit: 2022-06-22 | Discharge: 2022-06-22 | Disposition: A | Discharge status: Home / Self Care | Payer: Commercial Managed Care - PPO | Source: Ambulatory Visit | Attending: Internal Medicine | Admitting: Internal Medicine

## 2022-06-22 VITALS — BP 108/69 | HR 63 | Temp 98.4°F | Resp 16

## 2022-06-22 DIAGNOSIS — R197 Diarrhea, unspecified: Secondary | ICD-10-CM

## 2022-06-22 NOTE — ED Provider Notes (Signed)
[facility]    CSN: [id number] Arrival date & time: 06/22/22  1513    HISTORY   Chief Complaint  Patient presents with   Bloated   Diarrhea   HPI Lindsay Booth is a pleasant, 52 y.o. female who presents to urgent care today. Patient complains of a 1 week history of diarrhea and bloating.  Patient states she has been taking Pepto-Bismol chews without relief.  Patient states she has had diarrhea in the past and has a history of a sensitive stomach but has never had diarrhea this long.  Patient denies blood in her diarrhea, mucus in her diarrhea, fever, body aches, chills.  Patient states she has had some abdominal cramping but nothing severe.  Patient states her father has a history of diverticulitis, is wondering if that is what might be going on.  Patient states she reached out to her primary care provider who told her to come to urgent care for further evaluation.  The history is provided by the patient.   Past Medical History:  Diagnosis Date   Anemia    resolved   Asthma    Depression    Lightheadedness    Obesity    Palpitations    PONV (postoperative nausea and vomiting)    SOB (shortness of breath)    SVT (supraventricular tachycardia)    Tachycardia    Vitamin D deficiency    Wolff-Parkinson-White (WPW) pattern    There are no problems to display for this patient.  Past Surgical History:  Procedure Laterality Date   BREAST BIOPSY Right 04/19/2020   BREAST EXCISIONAL BIOPSY Right 07/15/2020   PASH   BREAST LUMPECTOMY WITH RADIOACTIVE SEED LOCALIZATION Right 07/15/2020   Procedure: RADIOACTIVE SEED GUIDED RIGHT BREAST LUMPECTOMY;  Surgeon: Erroll Luna, MD;  Location: [facility];  Service: General;  Laterality: Right;   CESAREAN SECTION     TUMOR REMOVAL     fatty tumor removed from L arm   OB History   No obstetric history on file.    Home Medications    Prior to Admission medications   Medication Sig Start Date End Date  Taking? Authorizing Provider  buPROPion (WELLBUTRIN XL) 150 MG 24 hr tablet Take 150 mg by mouth daily.    [provider]  ibuprofen (ADVIL) 800 MG tablet Take 1 tablet (800 mg total) by mouth every 8 (eight) hours as needed. 07/15/20   Cornett, Marcello Moores, MD  loratadine (CLARITIN) 10 MG tablet Take 10 mg by mouth daily.    [provider]  Omega-3 Fatty Acids (FISH OIL PO) Take by mouth.    [provider]  Pseudoeph-CPM-DM-APAP (TYLENOL COLD PO) Take by mouth 2 (two) times daily. Tylenol cold and sinus    [provider]  Vitamin D, Ergocalciferol, (DRISDOL) 50000 UNITS CAPS Take 50,000 Units by mouth.    [provider]    Family History Family History  Problem Relation Age of Onset   Hypertension Father    Breast cancer Paternal Aunt    Breast cancer Paternal Grandmother    Social History Social History   Tobacco Use   Smoking status: Never   Smokeless tobacco: Never  Substance Use Topics   Alcohol use: Yes    Comment: occasionally   Drug use: Never   Allergies   Patient has no known allergies.  Review of Systems Review of Systems Pertinent findings revealed after performing a 14 point review of systems has been noted in the history  of present illness.  Physical Exam Triage Vital Signs ED Triage Vitals  Enc Vitals Group     BP 04/15/21 0827 (!) 147/82     Pulse Rate 04/15/21 0827 72     Resp 04/15/21 0827 18     Temp 04/15/21 0827 98.3 F (36.8 C)     Temp Source 04/15/21 0827 Oral     SpO2 04/15/21 0827 98 %     Weight --      Height --      Head Circumference --      Peak Flow --      Pain Score 04/15/21 0826 5     Pain Loc --      Pain Edu? --      Excl. in [place]? --   No data found.  Updated Vital Signs BP 108/69 (BP Location: Left Arm)   Pulse 63   Temp 98.4 F (36.9 C) (Oral)   Resp 16   LMP 06/15/2022   SpO2 98%   Physical Exam Vitals and nursing note reviewed.  Constitutional:      General: She is  not in acute distress.    Appearance: Normal appearance. She is normal weight. She is not ill-appearing, toxic-appearing or diaphoretic.  Eyes:     Pupils: Pupils are equal, round, and reactive to light.  Cardiovascular:     Rate and Rhythm: Normal rate and regular rhythm.  Musculoskeletal:     Cervical back: Normal range of motion and neck supple.  Skin:    General: Skin is warm and dry.  Neurological:     Mental Status: She is alert.     Visual Acuity Right Eye Distance:   Left Eye Distance:   Bilateral Distance:    Right Eye Near:   Left Eye Near:    Bilateral Near:     UC Couse / Diagnostics / Procedures:     Radiology No results found.  Procedures Procedures (including critical care time) EKG  Pending results:  Labs Reviewed - No data to display  Medications Ordered in UC: Medications - No data to display  UC Diagnoses / Final Clinical Impressions(s)   I have reviewed the triage vital signs and the nursing notes.  Pertinent labs & imaging results that were available during my care of the patient were reviewed by me and considered in my medical decision making (see chart for details).    Final diagnoses:  Diarrhea, unspecified type   Patient is well-appearing and in no acute distress, vital signs are normal.  Patient politely declined offer for stool testing.  Patient politely declined offer for prescription for Imodium or Bentyl.  Patient states she plans to reach out to her primary care provider to follow-up instead.  No billable services were provided for this patient during her visit today.  Please see discharge instructions below for details of plan of care as provided to patient. ED Prescriptions   None    PDMP not reviewed this encounter.  Pending results:  Labs Reviewed - No data to display  Discharge Instructions: Discharge Instructions   None     Disposition Upon Discharge:  Condition: stable for discharge home  Patient presented with  an acute illness with associated systemic symptoms and significant discomfort requiring urgent management. In my opinion, this is a condition that a prudent lay person (someone who possesses an average knowledge of health and medicine) may potentially expect to result in complications if not addressed urgently such as respiratory distress, impairment  of bodily function or dysfunction of bodily organs.   Routine symptom specific, illness specific and/or disease specific instructions were discussed with the patient and/or caregiver at length.   As such, the patient has been evaluated and assessed, work-up was performed and treatment was provided in alignment with urgent care protocols and evidence based medicine.  Patient/parent/caregiver has been advised that the patient may require follow up for further testing and treatment if the symptoms continue in spite of treatment, as clinically indicated and appropriate.  Patient/parent/caregiver has been advised to return to the [facility] or PCP if no better; to PCP or the Emergency Department if new signs and symptoms develop, or if the current signs or symptoms continue to change or worsen for further workup, evaluation and treatment as clinically indicated and appropriate  The patient will follow up with their current PCP if and as advised. If the patient does not currently have a PCP we will assist them in obtaining one.   The patient may need specialty follow up if the symptoms continue, in spite of conservative treatment and management, for further workup, evaluation, consultation and treatment as clinically indicated and appropriate.  Patient/parent/caregiver verbalized understanding and agreement of plan as discussed.  All questions were addressed during visit.  Please see discharge instructions below for further details of plan.  This office note has been dictated using Teaching laboratory technician.  Unfortunately, this method of dictation can  sometimes lead to typographical or grammatical errors.  I apologize for your inconvenience in advance if this occurs.  Please do not hesitate to reach out to me if clarification is needed.      Theadora Rama Scales, [place] 06/22/22 [phone]

## 2022-06-22 NOTE — ED Triage Notes (Signed)
Pt c/o diarrhea (about a week), denies other symptoms.   Home interventions: Antidiarrheals

## 2022-12-27 LAB — HM PAP SMEAR: HPV, high-risk: NEGATIVE

## 2023-01-20 IMAGING — MG MM DIGITAL SCREENING BILAT W/ TOMO AND CAD
8 series · 8 of 24 positions shown · non-contrast
Comparison: Previous exam(s).

CLINICAL DATA: Screening.

EXAM:
DIGITAL SCREENING BILATERAL MAMMOGRAM WITH TOMOSYNTHESIS AND CAD
TECHNIQUE: Bilateral screening digital craniocaudal and mediolateral oblique
mammograms were obtained. Bilateral screening digital breast
tomosynthesis was performed. The images were evaluated with
computer-aided detection.

[R CC synth-2D]
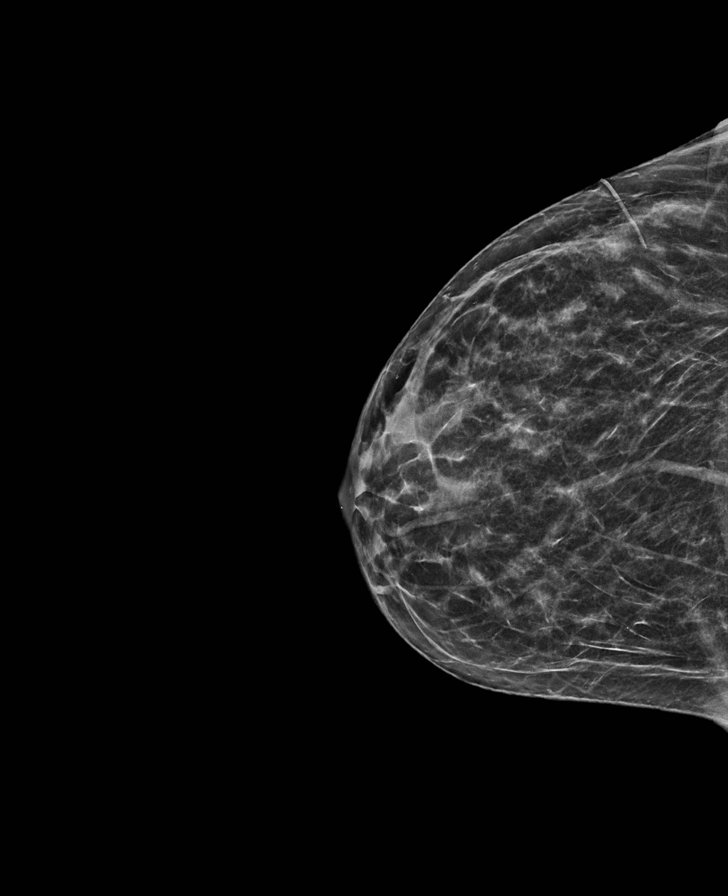

[R MLO synth-2D]
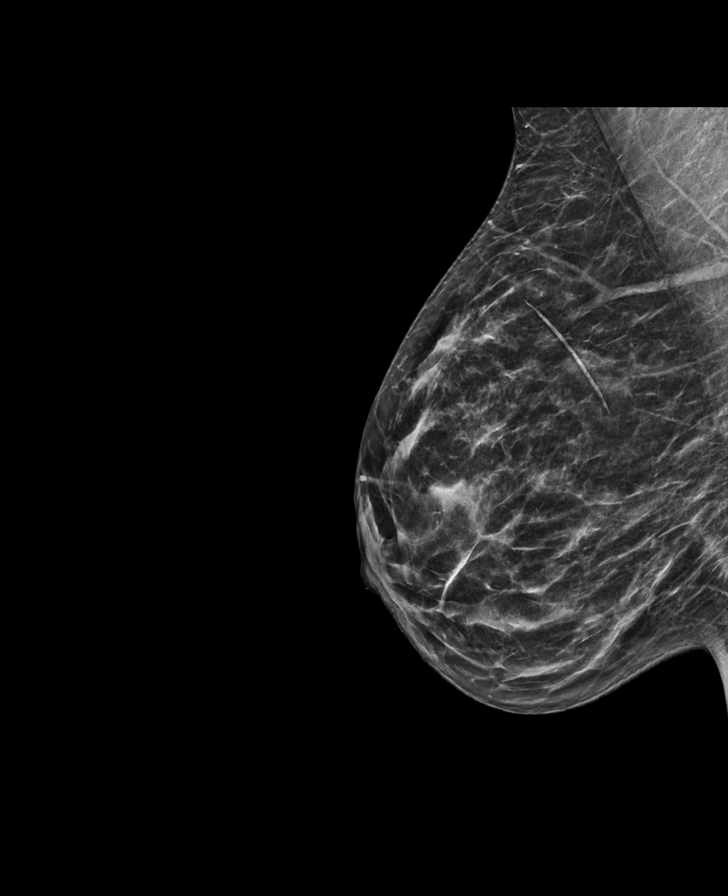

[L MLO synth-2D]
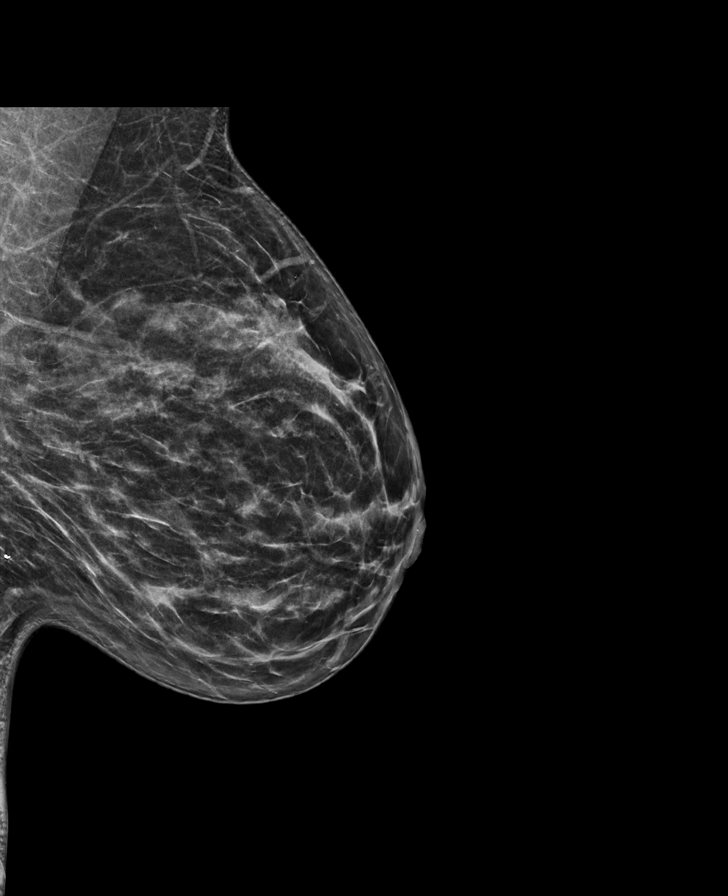

[L CC synth-2D]
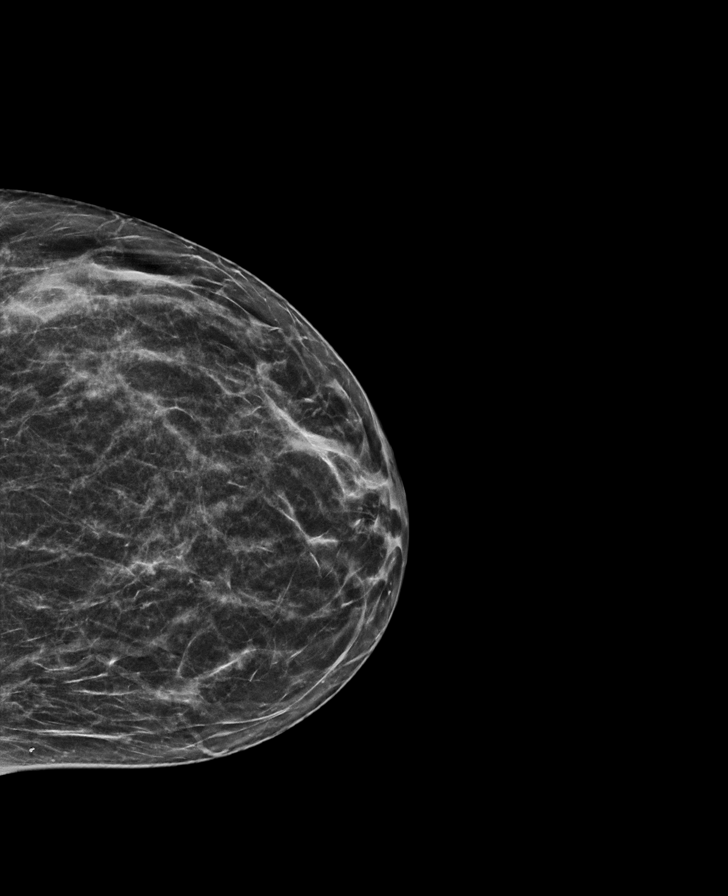

[R CC tomo · tomo slice 26/51.0]
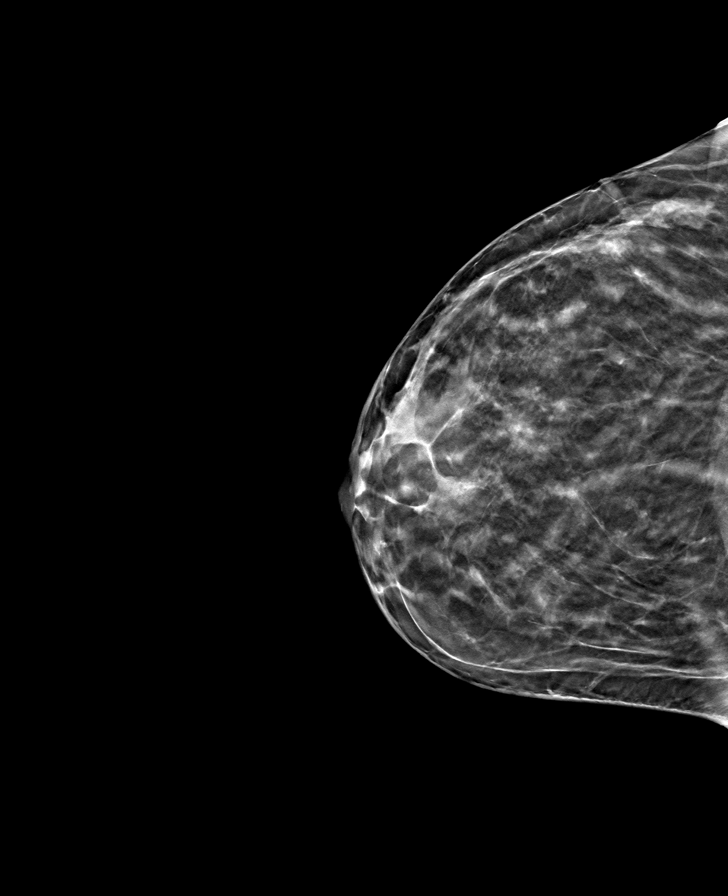

[R MLO tomo · tomo slice 29/56.0]
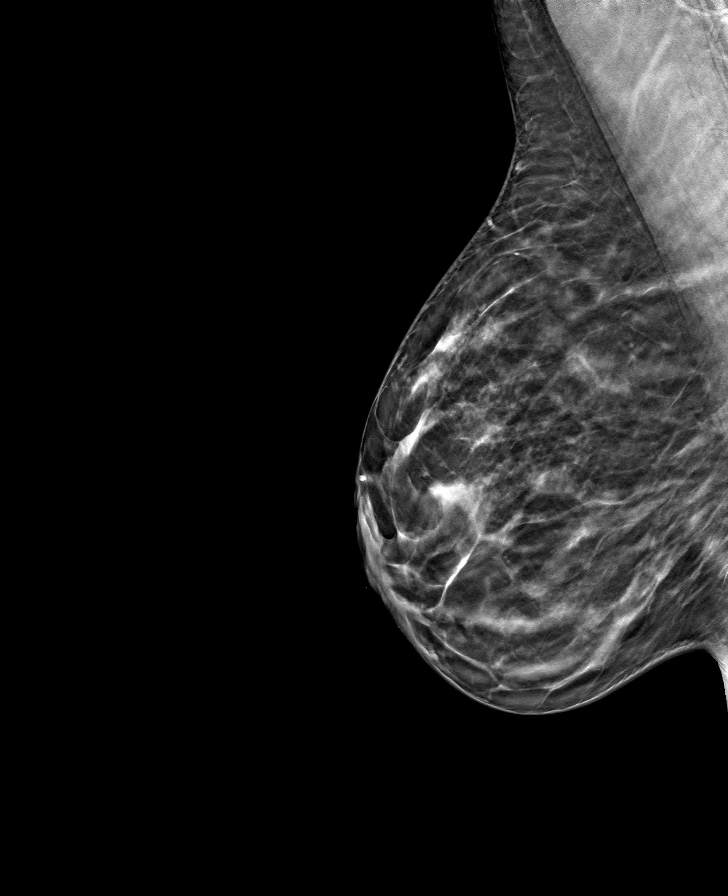

[L MLO tomo · tomo slice 31/60.0]
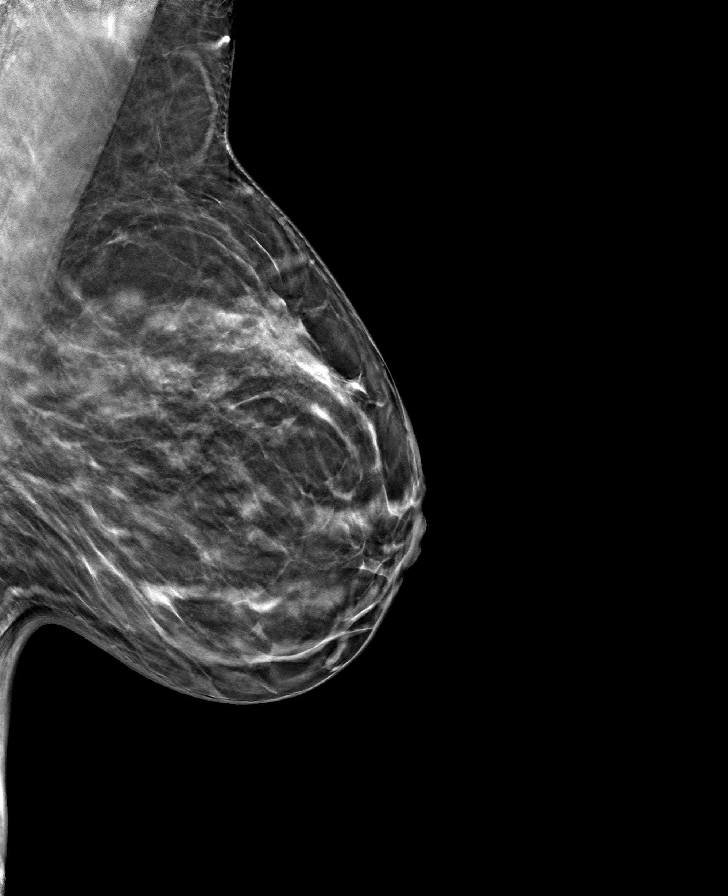

[L CC tomo · tomo slice 27/52.0]
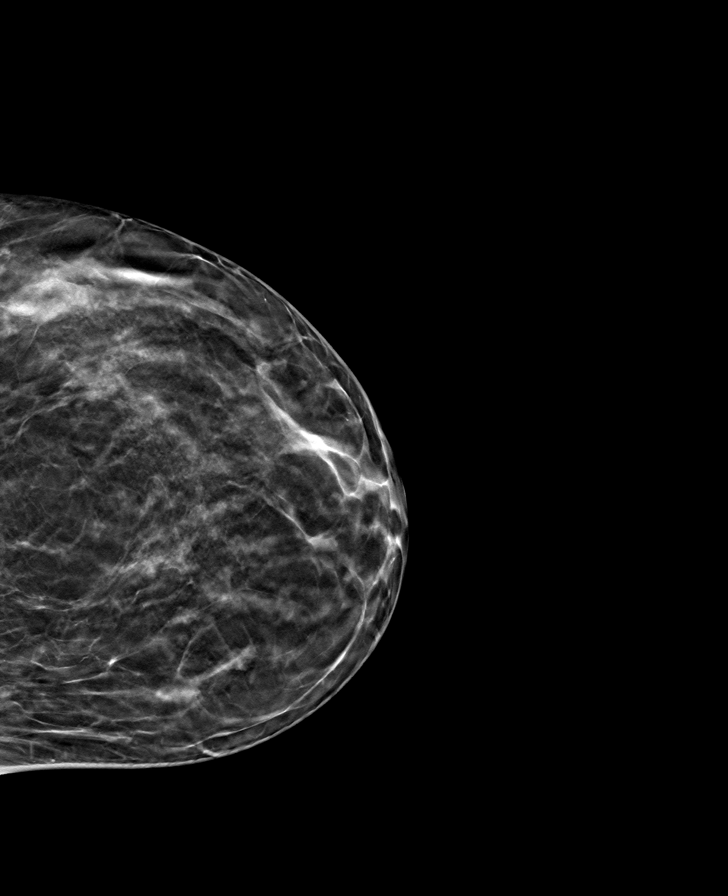

[8 of 24 positions shown; findings below may reference images not displayed]

ACR Breast Density Category b: There are scattered areas of
fibroglandular density.
FINDINGS: There are no findings suspicious for malignancy.
IMPRESSION: No mammographic evidence of malignancy. A result letter of this
screening mammogram will be mailed directly to the patient.

RECOMMENDATION:
Screening mammogram in one year. (Code:51-O-LD2)

BI-RADS CATEGORY  1: Negative.

## 2023-02-02 ENCOUNTER — Other Ambulatory Visit: Payer: Self-pay | Admitting: Obstetrics and Gynecology

## 2023-02-02 DIAGNOSIS — Z1231 Encounter for screening mammogram for malignant neoplasm of breast: Secondary | ICD-10-CM

## 2023-03-30 ENCOUNTER — Ambulatory Visit
Admission: RE | Admit: 2023-03-30 | Discharge: 2023-03-30 | Disposition: A | Discharge status: Home / Self Care | Payer: Commercial Managed Care - PPO | Source: Ambulatory Visit | Attending: Obstetrics and Gynecology | Admitting: Obstetrics and Gynecology

## 2023-03-30 DIAGNOSIS — Z1231 Encounter for screening mammogram for malignant neoplasm of breast: Secondary | ICD-10-CM

## 2023-04-04 ENCOUNTER — Other Ambulatory Visit: Payer: Self-pay | Admitting: Obstetrics and Gynecology

## 2023-04-04 DIAGNOSIS — R928 Other abnormal and inconclusive findings on diagnostic imaging of breast: Secondary | ICD-10-CM

## 2023-04-13 ENCOUNTER — Ambulatory Visit
Admission: RE | Admit: 2023-04-13 | Discharge: 2023-04-13 | Disposition: A | Discharge status: Home / Self Care | Payer: Commercial Managed Care - PPO | Source: Ambulatory Visit | Attending: Obstetrics and Gynecology | Admitting: Obstetrics and Gynecology

## 2023-04-13 ENCOUNTER — Other Ambulatory Visit: Payer: Self-pay | Admitting: Obstetrics and Gynecology

## 2023-04-13 DIAGNOSIS — N6310 Unspecified lump in the right breast, unspecified quadrant: Secondary | ICD-10-CM

## 2023-04-13 DIAGNOSIS — R928 Other abnormal and inconclusive findings on diagnostic imaging of breast: Secondary | ICD-10-CM

## 2023-04-13 DIAGNOSIS — N6489 Other specified disorders of breast: Secondary | ICD-10-CM

## 2023-05-02 ENCOUNTER — Other Ambulatory Visit: Payer: Self-pay | Admitting: Obstetrics and Gynecology

## 2023-05-02 DIAGNOSIS — R590 Localized enlarged lymph nodes: Secondary | ICD-10-CM

## 2023-05-03 ENCOUNTER — Ambulatory Visit
Admission: RE | Admit: 2023-05-03 | Discharge: 2023-05-03 | Disposition: A | Discharge status: Home / Self Care | Payer: Commercial Managed Care - PPO | Source: Ambulatory Visit | Attending: Obstetrics and Gynecology | Admitting: Obstetrics and Gynecology

## 2023-05-03 DIAGNOSIS — R590 Localized enlarged lymph nodes: Secondary | ICD-10-CM

## 2023-05-03 MED ORDER — IOPAMIDOL (ISOVUE-300) INJECTION 61%
200.0000 mL | Freq: Once | INTRAVENOUS | Status: AC | PRN
  Administered 2023-05-03: 100 mL via INTRAVENOUS

## 2023-05-07 ENCOUNTER — Inpatient Hospital Stay: Payer: Commercial Managed Care - PPO | Attending: Physician Assistant

## 2023-05-07 ENCOUNTER — Encounter: Payer: Self-pay | Admitting: Physician Assistant

## 2023-05-07 ENCOUNTER — Inpatient Hospital Stay: Payer: Commercial Managed Care - PPO | Admitting: Physician Assistant

## 2023-05-07 VITALS — BP 127/77 | HR 87 | Temp 98.6°F | Resp 16 | Ht 65.5 in | Wt 161.7 lb

## 2023-05-07 DIAGNOSIS — D649 Anemia, unspecified: Secondary | ICD-10-CM | POA: Insufficient documentation

## 2023-05-07 DIAGNOSIS — R591 Generalized enlarged lymph nodes: Secondary | ICD-10-CM

## 2023-05-07 DIAGNOSIS — D72829 Elevated white blood cell count, unspecified: Secondary | ICD-10-CM | POA: Insufficient documentation

## 2023-05-07 DIAGNOSIS — R161 Splenomegaly, not elsewhere classified: Secondary | ICD-10-CM | POA: Diagnosis not present

## 2023-05-07 LAB — HEPATITIS B SURFACE ANTIBODY,QUALITATIVE: Hep B S Ab: REACTIVE — AB

## 2023-05-07 LAB — CBC WITH DIFFERENTIAL (CANCER CENTER ONLY)
Abs Immature Granulocytes: 0.1 10*3/uL — ABNORMAL HIGH (ref 0.00–0.07)
Basophils Absolute: 0.1 10*3/uL (ref 0.0–0.1)
Basophils Relative: 1 %
Eosinophils Absolute: 0.2 10*3/uL (ref 0.0–0.5)
Eosinophils Relative: 1 %
HCT: 32.9 % — ABNORMAL LOW (ref 36.0–46.0)
Hemoglobin: 10.8 g/dL — ABNORMAL LOW (ref 12.0–15.0)
Immature Granulocytes: 1 %
Lymphocytes Relative: 52 %
Lymphs Abs: 7.7 10*3/uL — ABNORMAL HIGH (ref 0.7–4.0)
MCH: 27.5 pg (ref 26.0–34.0)
MCHC: 32.8 g/dL (ref 30.0–36.0)
MCV: 83.7 fL (ref 80.0–100.0)
Monocytes Absolute: 0.6 10*3/uL (ref 0.1–1.0)
Monocytes Relative: 4 %
Neutro Abs: 6 10*3/uL (ref 1.7–7.7)
Neutrophils Relative %: 41 %
Platelet Count: 247 10*3/uL (ref 150–400)
RBC: 3.93 MIL/uL (ref 3.87–5.11)
RDW: 13.7 % (ref 11.5–15.5)
Smear Review: NORMAL
WBC Count: 14.7 10*3/uL — ABNORMAL HIGH (ref 4.0–10.5)
nRBC: 0 % (ref 0.0–0.2)

## 2023-05-07 LAB — HIV ANTIBODY (ROUTINE TESTING W REFLEX): HIV Screen 4th Generation wRfx: NONREACTIVE

## 2023-05-07 LAB — CMP (CANCER CENTER ONLY)
ALT: 14 U/L (ref 0–44)
AST: 10 U/L — ABNORMAL LOW (ref 15–41)
Albumin: 4.5 g/dL (ref 3.5–5.0)
Alkaline Phosphatase: 68 U/L (ref 38–126)
Anion gap: 6 (ref 5–15)
BUN: 15 mg/dL (ref 6–20)
CO2: 27 mmol/L (ref 22–32)
Calcium: 9.6 mg/dL (ref 8.9–10.3)
Chloride: 103 mmol/L (ref 98–111)
Creatinine: 0.82 mg/dL (ref 0.44–1.00)
GFR, Estimated: >60 mL/min
Glucose, Bld: 93 mg/dL (ref 70–99)
Potassium: 3.9 mmol/L (ref 3.5–5.1)
Sodium: 136 mmol/L (ref 135–145)
Total Bilirubin: 0.4 mg/dL
Total Protein: 7.9 g/dL (ref 6.5–8.1)

## 2023-05-07 LAB — HEPATITIS B SURFACE ANTIGEN: Hepatitis B Surface Ag: NONREACTIVE

## 2023-05-07 LAB — SEDIMENTATION RATE: Sed Rate: 16 mm/h (ref 0–22)

## 2023-05-07 LAB — HEPATITIS C ANTIBODY: HCV Ab: NONREACTIVE

## 2023-05-07 LAB — C-REACTIVE PROTEIN: CRP: 0.7 mg/dL

## 2023-05-07 LAB — HEPATITIS B CORE ANTIBODY, TOTAL: Hep B Core Total Ab: NONREACTIVE

## 2023-05-07 LAB — LACTATE DEHYDROGENASE: LDH: 153 U/L (ref 98–192)

## 2023-05-07 NOTE — Progress Notes (Unsigned)
[facility] [phone]   [phone]  INITIAL CONSULTATION:  Patient Care Team: Cheron Schaumann., MD as PCP - General (Internal Medicine)  CHIEF COMPLAINTS/PURPOSE OF CONSULTATION:  "lymphadenopathy "  HISTORY OF PRESENTING ILLNESS:  Lindsay Booth 52 y.o. female with medical history significant for anemia and overactive bladder, Wolff-Parkinson-White Syndrome.  On review of the previous records patient is being referred by OB/GYN.  Patient seen in their office for abnormal bleeding that started 04/21/2023.  Patient was reporting bleeding heavy with clots. Ultrasound was performed 05/02/23 and showing posterior subserosal fibroid 2 cm as well as left ovarian cyst/corpus luteum.  Ultrasound also noted right inguinal large hypoechoic lymph nodes with peripheral vascularity. STAT CT AP performed 05/03/23 showing diffuse lymphadenopathy in the abdomen and pelvis beginning in the upper abdomen at the level of the gastrohepatic ligament, celiac region, periportal region and throughout the retroperitoneum.  Is also noted that diffuse central mesenteric lymphadenopathy also present.  Lymphadenopathies in the pelvis as well with bilateral enlarged iliac chain nodes and bilateral enlarged inguinal lymph nodes.  Scan showed mild to moderate splenomegaly as well. Lab work from same visit does show elevated absolute lymphocytes at 9.6. OBGYN prescribed progesterone to control irregular bleeding.   On exam today patient is accompanied by a friend who provides additional history. The patient reports she first noticed a swollen lymph node on left side of her neck in March, which was evaluated via ultrasound and deemed non-problematic at the time.The patient reported increased fatigue since the beginning of the school year in August, with instances of going to bed as early as 6:45 PM. 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 She denied experiencing night sweats but reported feeling hot frequently. She also denied any unintentional weight loss. The patient reported lower back pain, which started around the beginning of the school year. She also reported an occasional burning sensation in her stomach after eating. She denied any changes in appetite or early satiety, except for recent changes due to anxiety. The patient reported constipation, which she attributed to her iron supplementation, no acute changes. She denied any urinary symptoms or abdominal pain, except for recent discomfort due to stress and anxiety with possibility of cancer . She reported that her menstrual bleeding had stopped and she has not started the progesterone that was prescribed. She also reported tenderness in her groin area and under her arm, but no palpable lymph nodes were noted in these areas.  Further discussion reveal oncologic family history includes paternal grandmother and two paternal aunts with breast cancer, maternal grandmother had lymphoma and CLL. Patient is a never smoker. Last mammogram was in 04/13/2023 showing right breast with clustered cysts and it was recommended to have mammogram and ultrasound in 6 months. Last colonoscopy was 06/60/23 and normal. Last pap smear was in 12/08/2020  MEDICAL HISTORY:  Past Medical History:  Diagnosis Date   Anemia    resolved   Asthma    Depression    Lightheadedness    Obesity    Palpitations    PONV (postoperative nausea and vomiting)    SOB (shortness of breath)    SVT (supraventricular tachycardia) (HCC)    Tachycardia    Vitamin D deficiency    Wolff-Parkinson-White (WPW) pattern     SURGICAL HISTORY: Past Surgical History:  Procedure Laterality Date   BREAST BIOPSY Right 04/19/2020   BREAST EXCISIONAL BIOPSY Right 07/15/2020  PASH   BREAST LUMPECTOMY WITH RADIOACTIVE SEED LOCALIZATION Right 07/15/2020   Procedure: RADIOACTIVE SEED  GUIDED RIGHT BREAST LUMPECTOMY;  Surgeon: Harriette Bouillon, MD;  Location: [facility];  Service: General;  Laterality: Right;   CESAREAN SECTION     TUMOR REMOVAL     fatty tumor removed from L arm    SOCIAL HISTORY: Social History   Socioeconomic History   Marital status: Married    Spouse name: Not on file   Number of children: Not on file   Years of education: Not on file   Highest education level: Not on file  Occupational History   Not on file  Tobacco Use   Smoking status: Never   Smokeless tobacco: Never  Substance and Sexual Activity   Alcohol use: Yes    Comment: occasionally   Drug use: Never   Sexual activity: Not on file  Other Topics Concern   Not on file  Social History Narrative   Not on file   Social Determinants of Health   Financial Resource Strain: Not on file  Food Insecurity: Low Risk  (11/27/2022)   Received from [facility]   Hunger Vital Sign    Worried About Running Out of Food in the Last Year: Never true    Ran Out of Food in the Last Year: Never true  Transportation Needs: Not on file (11/27/2022)  Physical Activity: Not on file  Stress: Not on file  Social Connections: Unknown (11/01/2021)   Received from [facility], [facility]   Social Network    Social Network: Not on file  Intimate Partner Violence: Unknown (09/23/2021)   [facility] from [facility], [facility]   HITS    Physically Hurt: Not on file    Insult or Talk Down To: Not on file    Threaten Physical Harm: Not on file    Scream or Curse: Not on file    FAMILY HISTORY: Family History  Problem Relation Age of Onset   Hypertension Father    Breast cancer Paternal Aunt    Breast cancer Paternal Grandmother     ALLERGIES:  has No Known Allergies.  MEDICATIONS:  Current Outpatient Medications  Medication Sig Dispense Refill   buPROPion (WELLBUTRIN XL) 150 MG 24 hr tablet Take 150 mg by mouth daily.     ibuprofen (ADVIL) 800 MG tablet Take 1  tablet (800 mg total) by mouth every 8 (eight) hours as needed. 30 tablet 0   loratadine (CLARITIN) 10 MG tablet Take 10 mg by mouth daily.     Omega-3 Fatty Acids (FISH OIL PO) Take by mouth.     Pseudoeph-CPM-DM-APAP (TYLENOL COLD PO) Take by mouth 2 (two) times daily. Tylenol cold and sinus     Vitamin D, Ergocalciferol, (DRISDOL) 50000 UNITS CAPS Take 50,000 Units by mouth.     No current facility-administered medications for this visit.    REVIEW OF SYSTEMS:   All other systems are reviewed and are negative for acute change except as noted in the HPI.  PHYSICAL EXAMINATION: ECOG PERFORMANCE STATUS: 1 - Symptomatic but completely ambulatory  Vitals:   05/07/23 1235  BP: 127/77  Pulse: 87  Resp: 16  Temp: 98.6 F (37 C)  SpO2: 100%   Filed Weights   05/07/23 1235  Weight: 161 lb 11.2 oz (73.3 kg)    Physical Exam Vitals reviewed.  Constitutional:      Appearance: She is not ill-appearing or toxic-appearing.  HENT:  Head: Normocephalic.     Right Ear: External ear normal.     Left Ear: External ear normal.     Nose: Nose normal.  Eyes:     General: No scleral icterus.    Conjunctiva/sclera: Conjunctivae normal.  Cardiovascular:     Rate and Rhythm: Normal rate and regular rhythm.     Pulses: Normal pulses.     Heart sounds: Normal heart sounds.  Pulmonary:     Effort: Pulmonary effort is normal.     Breath sounds: Normal breath sounds.  Abdominal:     Palpations: Abdomen is soft.  Musculoskeletal:        General: Normal range of motion.     Cervical back: Normal range of motion.  Lymphadenopathy:     Cervical: Cervical adenopathy present.     Left cervical: Deep cervical adenopathy present.     Upper Body:     Right upper body: No supraclavicular, axillary, pectoral or epitrochlear adenopathy.     Left upper body: No supraclavicular, axillary, pectoral or epitrochlear adenopathy.     Lower Body: Right inguinal adenopathy present. No left inguinal  adenopathy.  Skin:    General: Skin is warm and dry.  Neurological:     Mental Status: She is alert and oriented to person, place, and time.       LABORATORY DATA:  I have reviewed the data as listed    Latest Ref Rng & Units 07/12/2020    8:30 AM  CBC  WBC 4.0 - 10.5 K/uL 6.0   Hemoglobin 12.0 - 15.0 g/dL 16.1   Hematocrit 09.6 - 46.0 % 34.3   Platelets 150 - 400 K/uL 296        Latest Ref Rng & Units 07/12/2020    8:30 AM  CMP  Glucose 70 - 99 mg/dL 89   BUN 6 - 20 mg/dL 12   Creatinine 0.45 - 1.00 mg/dL 4.09   Sodium 811 - 914 mmol/L 138   Potassium 3.5 - 5.1 mmol/L 4.5   Chloride 98 - 111 mmol/L 101   CO2 22 - 32 mmol/L 27   Calcium 8.9 - 10.3 mg/dL 9.2   Total Protein 6.5 - 8.1 g/dL 6.6   Total Bilirubin 0.3 - 1.2 mg/dL 0.5   Alkaline Phos 38 - 126 U/L 36   AST 15 - 41 U/L 19   ALT 0 - 44 U/L 15      RADIOGRAPHIC STUDIES: I have personally reviewed the radiological images as listed and agreed with the findings in the report. CT ABDOMEN PELVIS W CONTRAST  Result Date: 05/03/2023 CLINICAL DATA:  Enlarged pelvic lymph nodes. EXAM: CT ABDOMEN AND PELVIS WITH CONTRAST TECHNIQUE: Multidetector CT imaging of the abdomen and pelvis was performed using the standard protocol following bolus administration of intravenous contrast. RADIATION DOSE REDUCTION: This exam was performed according to the departmental dose-optimization program which includes automated exposure control, adjustment of the mA and/or kV according to patient size and/or use of iterative reconstruction technique. CONTRAST:  ISOVUE-300 IOPAMIDOL (ISOVUE-300) INJECTION 61% COMPARISON:  None Available. FINDINGS: Lower chest: No acute abnormality. Hepatobiliary: No focal liver abnormality is seen. Two small calcified gallstones are identified in the gallbladder. No evidence of gallbladder distension, inflammation or biliary ductal dilatation. Pancreas: Unremarkable. No pancreatic ductal dilatation or  surrounding inflammatory changes. Spleen: Splenic dimensions of approximately 14.2 x 11.9 x 5.9 cm with estimated splenic volume of 518 mL which represents mild to moderate splenomegaly in a female. Adrenals/Urinary Tract:  Adrenal glands are unremarkable. Kidneys are normal, without renal calculi, focal lesion, or hydronephrosis. Bladder is unremarkable. Stomach/Bowel: Bowel shows no evidence of obstruction, ileus, inflammation or lesion. The appendix is difficult to discretely appreciated. No free intraperitoneal air. Vascular/Lymphatic: No vascular abnormalities. Diffuse lymphadenopathy in the abdomen and pelvis beginning in the upper abdomen at the level of the gastrohepatic ligament, celiac region, periportal region and throughout the retroperitoneum. Diffuse central mesenteric lymphadenopathy also present. Largest single lymph node localizes to the lower left retroperitoneum just below the aortic bifurcation and anterior to the left psoas muscle measuring approximately 2.5 x 2.9 cm. Lymphadenopathy continues in the pelvis with bilateral enlarged iliac chain nodes and bilateral enlarged inguinal lymph nodes. The largest inguinal lymph node is in the lower right inguinal region measuring approximately 1.6 x 2.1 cm. The findings of diffuse lymphadenopathy as well as splenomegaly point towards lymphoma as the top differential diagnosis. Other hematologic neoplasms such as CLL would also be in the differential. Reproductive: Uterus and bilateral adnexa are unremarkable. Other: No abdominal wall hernia or abnormality. No abdominopelvic ascites. Musculoskeletal: No acute or significant osseous findings. IMPRESSION: 1. Diffuse lymphadenopathy in the abdomen and pelvis beginning in the upper abdomen at the level of the gastrohepatic ligament, celiac region, periportal region and throughout the retroperitoneum. Diffuse central mesenteric lymphadenopathy also present. Largest single lymph node localizes to the lower left  retroperitoneum just below the aortic bifurcation and anterior to the left psoas muscle measuring approximately 2.5 x 2.9 cm. Lymphadenopathy continues in the pelvis with bilateral enlarged iliac chain nodes and bilateral enlarged inguinal lymph nodes. The findings of diffuse lymphadenopathy as well as splenomegaly point towards lymphoma as the top differential diagnosis. Other hematologic neoplasms such as CLL would also be in the differential. 2. Recommend referral to Hematology and Oncology for further workup. 3. Cholelithiasis without evidence of acute cholecystitis. Electronically Signed   By: Irish Lack M.D.   On: 05/03/2023 13:49   MM 3D DIAGNOSTIC MAMMOGRAM UNILATERAL RIGHT BREAST  Result Date: 04/13/2023 CLINICAL DATA:  Recall from screening mammography, possible focal asymmetry involving the UPPER OUTER QUADRANT of the RIGHT breast at posterior depth near the site of a prior excisional biopsy of atypical lobular hyperplasia in 2021. EXAM: DIGITAL DIAGNOSTIC UNILATERAL RIGHT MAMMOGRAM WITH TOMOSYNTHESIS AND CAD; ULTRASOUND RIGHT BREAST LIMITED TECHNIQUE: Right digital diagnostic mammography and breast tomosynthesis was performed. The images were evaluated with computer-aided detection. ; Targeted ultrasound examination of the right breast was performed COMPARISON:  Previous exam(s). ACR Breast Density Category b: There are scattered areas of fibroglandular density. FINDINGS: Spot compression CC and MLO views of the area of concern were obtained. The focal asymmetry in the UPPER OUTER QUADRANT at posterior depth questioned on screening mammography persists but partially disperses with compression, likely indicating fibroglandular tissue. There may be a circumscribed isodense mass at this location as well, localizing to the anterior margin of the surgical scar. Scattered faint calcifications are present, some of which appear to layer on the MLO view. Targeted ultrasound is performed, demonstrating  likely benign clustered cysts within dense fibroglandular tissue at 10 o'clock 7 cm from the nipple measuring approximately 0.8 x 0.3 x 0.5 cm, demonstrating slight posterior acoustic enhancement and no internal power Doppler flow, likely corresponding to the mammographic mass. No suspicious solid mass or abnormal acoustic shadowing is identified. IMPRESSION: Likely benign 0.8 cm clustered cysts in the UPPER OUTER QUADRANT of the RIGHT breast at 10 o'clock 7 cm from the nipple which likely accounts for the  screening mammographic finding. RECOMMENDATION: Diagnostic RIGHT mammogram and RIGHT breast ultrasound in 6 months. I have discussed the findings and recommendations with the patient. If applicable, a reminder letter will be sent to the patient regarding the next appointment. BI-RADS CATEGORY  3: Probably benign. Electronically Signed   By: Hulan Saas M.D.   On: 04/13/2023 14:48   [place] LIMITED ULTRASOUND INCLUDING AXILLA RIGHT BREAST  Result Date: 04/13/2023 CLINICAL DATA:  Recall from screening mammography, possible focal asymmetry involving the UPPER OUTER QUADRANT of the RIGHT breast at posterior depth near the site of a prior excisional biopsy of atypical lobular hyperplasia in 2021. EXAM: DIGITAL DIAGNOSTIC UNILATERAL RIGHT MAMMOGRAM WITH TOMOSYNTHESIS AND CAD; ULTRASOUND RIGHT BREAST LIMITED TECHNIQUE: Right digital diagnostic mammography and breast tomosynthesis was performed. The images were evaluated with computer-aided detection. ; Targeted ultrasound examination of the right breast was performed COMPARISON:  Previous exam(s). ACR Breast Density Category b: There are scattered areas of fibroglandular density. FINDINGS: Spot compression CC and MLO views of the area of concern were obtained. The focal asymmetry in the UPPER OUTER QUADRANT at posterior depth questioned on screening mammography persists but partially disperses with compression, likely indicating fibroglandular tissue. There may be a  circumscribed isodense mass at this location as well, localizing to the anterior margin of the surgical scar. Scattered faint calcifications are present, some of which appear to layer on the MLO view. Targeted ultrasound is performed, demonstrating likely benign clustered cysts within dense fibroglandular tissue at 10 o'clock 7 cm from the nipple measuring approximately 0.8 x 0.3 x 0.5 cm, demonstrating slight posterior acoustic enhancement and no internal power Doppler flow, likely corresponding to the mammographic mass. No suspicious solid mass or abnormal acoustic shadowing is identified. IMPRESSION: Likely benign 0.8 cm clustered cysts in the UPPER OUTER QUADRANT of the RIGHT breast at 10 o'clock 7 cm from the nipple which likely accounts for the screening mammographic finding. RECOMMENDATION: Diagnostic RIGHT mammogram and RIGHT breast ultrasound in 6 months. I have discussed the findings and recommendations with the patient. If applicable, a reminder letter will be sent to the patient regarding the next appointment. BI-RADS CATEGORY  3: Probably benign. Electronically Signed   By: Hulan Saas M.D.   On: 04/13/2023 14:48    ASSESSMENT & PLAN [place] Sou Debernardi is a 52 y.o. female [facility] for consultation regarding lymphadenopathy. We have reviewed etiologies including infectious process, inflammatory process, or lymphoproliferative disorder. Patient will proceed with laboratory workup today.   #Lymphadenopathy -Reviewed recent CT AP with patient in detail -Labs collected today include CBC, CMP, ESR, CRP, LDH, flow cytometry, Hep B & C serologies, HIV serology. -CT chest ordered to evaluate for lymphadenopathy as well and to establish a baseline.  -Patient is scheduled to see provider at [facility] and is unsure where she will establish care yet. She agrees to proceed with work up for now. -If flow cytology is unremarkable, will consider biopsy.  #Age related  screenings -UTD with mammogram and colonoscopy.  -Patient will RTC when work up is complete.  Patient expressed understanding of the recommended workup and is agreeable to move forward.   All questions were answered. The patient knows to call the clinic with any problems, questions or concerns.  Shared visit with Dr. Leonides Schanz.  Orders Placed This Encounter  Procedures   CT Chest W Contrast    Standing Status:   Future    Standing Expiration Date:   05/06/2024    Order Specific Question:  If indicated for the ordered procedure, I authorize the administration of contrast media per Radiology protocol    Answer:   Yes    Order Specific Question:   Does the patient have a contrast media/X-ray dye allergy?    Answer:   No    Order Specific Question:   Is patient pregnant?    Answer:   No    Order Specific Question:   Preferred imaging location?    Answer:   [facility]   CBC with Differential (Cancer Center Only)    Standing Status:   Future    Number of Occurrences:   1    Standing Expiration Date:   05/06/2024   CMP (Cancer Center only)    Standing Status:   Future    Number of Occurrences:   1    Standing Expiration Date:   05/06/2024   Sedimentation rate    Standing Status:   Future    Number of Occurrences:   1    Standing Expiration Date:   05/06/2024   C-reactive protein    Standing Status:   Future    Number of Occurrences:   1    Standing Expiration Date:   05/06/2024   HIV antibody (with reflex)    Standing Status:   Future    Number of Occurrences:   1    Standing Expiration Date:   05/06/2024   Hepatitis C antibody    Standing Status:   Future    Number of Occurrences:   1    Standing Expiration Date:   05/06/2024   Hepatitis B surface antibody    Standing Status:   Future    Number of Occurrences:   1    Standing Expiration Date:   05/06/2024   Hepatitis B surface antigen    Standing Status:   Future    Number of Occurrences:   1    Standing  Expiration Date:   05/06/2024   Hepatitis B core antibody, total    Standing Status:   Future    Number of Occurrences:   1    Standing Expiration Date:   05/06/2024   Flow Cytometry, Peripheral Blood (Oncology)    Standing Status:   Future    Number of Occurrences:   1    Standing Expiration Date:   05/06/2024   Lactate dehydrogenase (LDH)    Standing Status:   Future    Number of Occurrences:   1    Standing Expiration Date:   05/06/2024      I have spent a total of 60 minutes minutes of face-to-face and non-face-to-face time, preparing to see the patient, obtaining and/or reviewing separately obtained history, performing a medically appropriate examination, counseling and educating the patient, ordering medications/tests/procedures, referring and communicating with other health care professionals, documenting clinical information in the electronic health record, independently interpreting results and communicating results to the patient, and care coordination.   Namon Cirri PA-C Department of Hematology/Oncology [facility] at [facility] Phone: [phone]  I have read the above note and personally examined the patient. I agree with the assessment and plan as noted above.  Briefly Mrs. Alohilani Yaldo is a 52 year old female who presents for evaluation of lymphadenopathy and splenomegaly.  Additionally the patient has a lymphocyte predominant leukocytosis.  Patient had normal labs back in January 2022 but had a marked increase in her lymphocyte count.  At this time findings are most concerning for a  indolent lymphoma such as CLL versus splenic marginal zone lymphoma.  We will order labs to include CBC, CMP, ESR, CRP, and flow cytometry.  Typically we are able to diagnose lymphomas with peripheral lymphocytosis with flow cytometry alone, however if we are not would not recommend pursuing an excisional lymph node biopsy.  Would also recommend obtaining a CT  scan of the chest to evaluate the bulkiness of her lymphadenopathy.  The patient voiced understanding of our findings and plan moving forward.   Ulysees Barns, MD Department of Hematology/Oncology [facility] at [facility] Phone: [phone] Pager: [phone] Email: [email or web address]

## 2023-05-07 NOTE — Patient Instructions (Addendum)
Diagnostic Clinic Office Visit Discharge Information and Instructions  Thank you for choosing [facility], [facility], [facility] for your healthcare needs.  Below is a summary of today's discussion, along with our contact information and an outline of what to expect next.  Reason for Visit:  enlarged lymph nodes and spleen  Proposed Diagnostic Care Plan: Labs collected today. CT scan of your chest ordered to evaluate to enlarged lymph nodes. I have contacted our authorizations team to get the scan approved. Once it is I can call you/send a MyChart message letting you know. Once approved please call the Central Scheduling department at [phone] to schedule the scan. Depending on lab results and CT scan of your chest we will determine if biopsy is needed.  What to Expect: - Generally, when lab tests are ordered the results can take up to 1 week for results to be available.  At that point, we will contact you to discuss your results with you.  Unless there is a critical result, we will typically wait for all of your lab results to be available before contacting you. - If a biopsy is part of your Care Plan, those results can take on average 7-10 days to result.  Once results are available, we will contact you to discuss your pathology results and any next steps. - If you have additional imaging ordered, such as a CT Scan, MRI, Ultrasound, Bone Scan, or PET scan, your imaging will need to be authorized then scheduled with the earliest available appointment.  You may be asked to travel to another hospital within [facility] who has a sooner availability, please consider doing so if asked. - If you use MyChart, your results will be available to you in the MyChart portal.  Your provider will be in touch with you as soon as all of your results are available to be discussed.  Your Diagnostic Clinic Provider:  Festus Aloe PA-C and Dr. Leonides Schanz  If you or your caregiver have number blocking on your cell phones, please  ensure the cancer center's numbers are not blocked.  If you are not a registered MyChart user, please consider enrolling in MyChart to receive your test results and visit notes.  You can also access your discharge instructions electronically.  MyChart also gives you an electronic means to communicate with your Care Team instead of needing to call in to the cancer center.  We appreciate you trusting [place] with your healthcare and look forward to partnering with you as we work to uncover what your potential diagnosis may be.  Please do not hesitate to reach out at any point with questions or concerns.

## 2023-05-08 ENCOUNTER — Ambulatory Visit (HOSPITAL_BASED_OUTPATIENT_CLINIC_OR_DEPARTMENT_OTHER)
Admission: RE | Admit: 2023-05-08 | Discharge: 2023-05-08 | Disposition: A | Discharge status: Home / Self Care | Payer: Commercial Managed Care - PPO | Source: Ambulatory Visit | Attending: Physician Assistant | Admitting: Physician Assistant

## 2023-05-08 DIAGNOSIS — R591 Generalized enlarged lymph nodes: Secondary | ICD-10-CM | POA: Insufficient documentation

## 2023-05-08 LAB — SURGICAL PATHOLOGY

## 2023-05-08 MED ORDER — IOHEXOL 300 MG/ML  SOLN
75.0000 mL | Freq: Once | INTRAMUSCULAR | Status: AC | PRN
  Administered 2023-05-08: 75 mL via INTRAVENOUS

## 2023-05-10 LAB — FLOW CYTOMETRY

## 2023-05-25 ENCOUNTER — Encounter: Payer: Self-pay | Admitting: Physician Assistant

## 2023-06-06 DIAGNOSIS — C8218 Follicular lymphoma grade II, lymph nodes of multiple sites: Secondary | ICD-10-CM | POA: Insufficient documentation

## 2023-07-23 ENCOUNTER — Encounter: Payer: Self-pay | Admitting: Obstetrics and Gynecology

## 2023-10-17 DIAGNOSIS — T451X5A Adverse effect of antineoplastic and immunosuppressive drugs, initial encounter: Secondary | ICD-10-CM | POA: Insufficient documentation

## 2023-10-22 ENCOUNTER — Ambulatory Visit
Admission: RE | Admit: 2023-10-22 | Discharge: 2023-10-22 | Disposition: A | Discharge status: Home / Self Care | Payer: Commercial Managed Care - PPO | Source: Ambulatory Visit | Attending: Obstetrics and Gynecology | Admitting: Obstetrics and Gynecology

## 2023-10-22 DIAGNOSIS — N6310 Unspecified lump in the right breast, unspecified quadrant: Secondary | ICD-10-CM

## 2023-10-22 DIAGNOSIS — N6489 Other specified disorders of breast: Secondary | ICD-10-CM

## 2023-10-29 ENCOUNTER — Other Ambulatory Visit: Payer: Self-pay | Admitting: Obstetrics and Gynecology

## 2023-10-29 DIAGNOSIS — N6310 Unspecified lump in the right breast, unspecified quadrant: Secondary | ICD-10-CM

## 2024-04-22 ENCOUNTER — Encounter (HOSPITAL_BASED_OUTPATIENT_CLINIC_OR_DEPARTMENT_OTHER): Payer: Self-pay | Admitting: Family Medicine

## 2024-04-22 ENCOUNTER — Ambulatory Visit (INDEPENDENT_AMBULATORY_CARE_PROVIDER_SITE_OTHER): Admitting: Family Medicine

## 2024-04-22 VITALS — BP 120/77 | HR 65 | Temp 98.1°F | Ht 65.5 in | Wt 173.0 lb

## 2024-04-22 DIAGNOSIS — J018 Other acute sinusitis: Secondary | ICD-10-CM | POA: Diagnosis not present

## 2024-04-22 DIAGNOSIS — F419 Anxiety disorder, unspecified: Secondary | ICD-10-CM

## 2024-04-22 DIAGNOSIS — Z7689 Persons encountering health services in other specified circumstances: Secondary | ICD-10-CM

## 2024-04-22 DIAGNOSIS — D508 Other iron deficiency anemias: Secondary | ICD-10-CM | POA: Diagnosis not present

## 2024-04-22 DIAGNOSIS — Z8742 Personal history of other diseases of the female genital tract: Secondary | ICD-10-CM

## 2024-04-22 DIAGNOSIS — C8218 Follicular lymphoma grade II, lymph nodes of multiple sites: Secondary | ICD-10-CM | POA: Diagnosis not present

## 2024-04-22 DIAGNOSIS — N3281 Overactive bladder: Secondary | ICD-10-CM | POA: Insufficient documentation

## 2024-04-22 DIAGNOSIS — D649 Anemia, unspecified: Secondary | ICD-10-CM | POA: Insufficient documentation

## 2024-04-22 MED ORDER — BUPROPION HCL ER (XL) 150 MG PO TB24: 150.0000 mg | ORAL_TABLET | Freq: Every day | ORAL | 3 refills | Status: AC

## 2024-04-22 MED ORDER — DOXYCYCLINE HYCLATE 100 MG PO TABS: 100.0000 mg | ORAL_TABLET | Freq: Two times a day (BID) | ORAL | 0 refills | Status: DC

## 2024-04-22 NOTE — Progress Notes (Signed)
 Subjective:   Lindsay Booth January 14, 1971 04/22/2024  Chief Complaint  Patient presents with   New Patient (Initial Visit)    Patient is here today to get established with the practice. States that she has had head congestion with sinus drainage and feels like it is has gone to her chest. Denies any fever that she is aware of.    Discussed the use of AI scribe software for clinical note transcription with the patient, who gave verbal consent to proceed.  History of Present Illness Lindsay Booth is a 53 year old female who presents for establishment of care and review of her medical history.  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 A pelvic ultrasound revealed enlarged lymph nodes, and subsequent blood work showed a high white blood cell count. A CT scan and biopsy confirmed the diagnosis. She underwent six months of chemotherapy from January to May. A follow-up PET scan in the summer showed good results, with some areas being monitored, including a lung nodule. She is scheduled for a CT scan in December and has a follow-up with her oncologist tomorrow. She is followed by Dr. Claudean with [facility] per chart review.   She has a history of chronic anemia and vitamin D deficiency, which were noted prior to her lymphoma diagnosis. She takes iron supplements daily and vitamin D, though she is unsure of the exact dosage. She also takes Wellbutrin 150 mg once daily for anxiety, with a recent prescription refill needed. Reports anxiety is currently well controlled.   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 She undergoes regular mammograms and ultrasounds, with the next mammogram scheduled for November 12th. She manages her breast health independently, preferring the services at the breast center.  URI:  She reports recent  sinus symptoms, including a sore throat that started yesterday. No fever, nausea, vomiting, shortness of breath, or cough. She has not had a sinus infection in a couple of years. She uses Flonase nasal spray, Claritin for allergies, and takes Tylenol  cold and flu as needed. 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     The following portions of the patient's history were reviewed and updated as appropriate: past medical history, past surgical history, family history, social history, allergies, medications, and problem list.   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   Chemotherapy induced nausea and vomiting 10/17/2023   Depression    Lightheadedness    Lymphoma (HCC)    Obesity    Overactive bladder 04/22/2024   Palpitations    PONV (postoperative nausea and vomiting)    SOB (shortness of breath)    SVT (supraventricular tachycardia)    Tachycardia    Vitamin D deficiency    Wolff-Parkinson-White (WPW) pattern    Seen by [facility]; asymptomatic and no treatment required   Past Surgical History:  Procedure Laterality Date   BREAST BIOPSY Right 04/19/2020   BREAST  EXCISIONAL BIOPSY Right 07/15/2020   PASH   BREAST LUMPECTOMY WITH RADIOACTIVE SEED LOCALIZATION Right 07/15/2020   Procedure: RADIOACTIVE SEED GUIDED RIGHT BREAST LUMPECTOMY;  Surgeon: Vanderbilt Ned, MD;  Location: [facility];  Service: General;  Laterality: Right;   CESAREAN SECTION     TUMOR REMOVAL     fatty tumor removed from L arm   Family History  Problem Relation Age of Onset   Hypertension Father    Breast  cancer Paternal Aunt    Breast cancer Paternal Grandmother    Outpatient Medications Prior to Visit  Medication Sig Dispense Refill   estradiol (ESTRACE) 0.01 % CREA vaginal cream Place vaginally at bedtime.     Ferrous Sulfate (IRON) 325 (65 Fe) MG TABS Take 325 mg by mouth daily.     fluticasone (VERAMYST) 27.5 MCG/SPRAY nasal spray Place 2 sprays into the nose daily.     ibuprofen  (ADVIL ) 800 MG tablet Take 1 tablet (800 mg total) by mouth every 8 (eight) hours as needed. 30 tablet 0   loratadine (CLARITIN) 10 MG tablet Take 10 mg by mouth daily.     Pseudoeph-CPM-DM-APAP (TYLENOL  COLD PO) Take by mouth 2 (two) times daily. Tylenol  cold and sinus     Vitamin D, Ergocalciferol, (DRISDOL) 50000 UNITS CAPS Take 50,000 Units by mouth.     buPROPion (WELLBUTRIN XL) 150 MG 24 hr tablet Take 150 mg by mouth daily.     ASHWAGANDHA 35 PO Take 35 mg by mouth daily.     No facility-administered medications prior to visit.   No Known Allergies   ROS: A complete ROS was performed with pertinent positives/negatives noted in the HPI. The remainder of the ROS are negative.    Objective:   Today's Vitals   04/22/24 1510  BP: 120/77  Pulse: 65  Temp: 98.1 F (36.7 C)  TempSrc: Oral  SpO2: 98%  Weight: 173 lb (78.5 kg)  Height: 5' 5.5 (1.664 m)    Physical Exam HEENT: Fluid in ears  GENERAL: Well-appearing, in NAD. Well nourished.  SKIN: Pink, warm and dry.  Head: Normocephalic. Frontal and maxillary sinus tenderness present.  NECK: Trachea midline. Full ROM w/o pain or tenderness. No lymphadenopathy.  EARS: Tympanic membranes are intact, mildly injected bilaterally without bulging and without drainage. Appropriate landmarks visualized.  EYES: Conjunctiva clear without exudates. EOMI, PERRL, no drainage present.  NOSE: Septum midline w/o deformity. Nares patent, mucosa pink and non-inflamed w/o drainage. No sinus tenderness.  THROAT: Uvula midline. Oropharynx clear. Mild postnasal drip  present.  Mucous membranes pink and moist.  RESPIRATORY: Chest wall symmetrical. Respirations even and non-labored. Breath sounds clear to auscultation bilaterally.  CARDIAC: S1, S2 present, regular rate and rhythm without murmur or gallops. Peripheral pulses 2+ bilaterally.  MSK: Muscle tone and strength appropriate for age.  EXTREMITIES: Without clubbing, cyanosis, or edema.  NEUROLOGIC: No motor or sensory deficits. Steady, even gait. C2-C12 intact.  PSYCH/MENTAL STATUS: Alert, oriented x 3. Cooperative, appropriate mood and affect.      Assessment & Plan:  1. Encounter to establish care with new doctor (Primary) Discussed role of PCP and reviewed her medical, surgical and family history.   2. 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 Patient will continue management by Oncology.   3. Anxiety Controlled. Wellbutrin refilled for patient.   4. History of atypical hyperplasia of breast Discussed increased risk and recommendation for possible breast  MRI. Will complete scheduled mammogram with Breast center on 04/30/2024 and follow recommendations for follow up.   5. Other iron deficiency anemia Currently well controlled with recent CBC in July 2025. Continue iron supplement. Will plan to repeat in January at time of AE.    6. Acute non-recurrent sinusitis of other sinus Start Doxycycline 100mg  BID x 7 days. Continue use of antihistamine and Flonase as needed. Recommend sinus rinses if needed. If no improvement or worsening in 72 hours, reach out to PCP.    Meds ordered this encounter  Medications   buPROPion (WELLBUTRIN XL) 150 MG 24 hr tablet    Sig: Take 1 tablet (150 mg total) by mouth daily.    Dispense:  90 tablet    Refill:  3    Supervising Provider:   DE CUBA, RAYMOND J [[id number]]   doxycycline (VIBRA-TABS) 100 MG tablet    Sig: Take 1 tablet (100 mg total) by mouth 2 (two) times  daily.    Dispense:  14 tablet    Refill:  0    Supervising Provider:   DE CUBA, RAYMOND J [[id number]]   Lab Orders  No laboratory test(s) ordered today   No images are attached to the encounter or orders placed in the encounter.  Return in about 2 months (around 06/23/2024) for ANNUAL PHYSICAL (fasting labs same day) .    Patient to reach out to office if new, worrisome, or unresolved symptoms arise or if no improvement in patient's condition. Patient verbalized understanding and is agreeable to treatment plan. All questions answered to patient's satisfaction.    Thersia Schuyler Stark, [place]

## 2024-04-22 NOTE — Patient Instructions (Signed)
  VISIT SUMMARY: Today, you came in to establish care and review your medical history. We discussed your follicular lymphoma, chronic anemia, vitamin D deficiency, breast health, anxiety and recent sinus symptoms. We also reviewed your general health maintenance plan.  YOUR PLAN: -FOLLICULAR LYMPHOMA: Follicular lymphoma is a type of blood cancer that affects the lymphatic system. You have completed chemotherapy and are currently under surveillance. Your recent PET scan showed stable results, and you have a follow-up CT scan scheduled for December. Continue to follow up with your oncologist, including your appointment tomorrow.  -CHRONIC ANEMIA AND VITAMIN D DEFICIENCY: Chronic anemia is a condition where you have a lower than normal number of red blood cells, and vitamin D deficiency means you have lower than normal levels of vitamin D. Continue taking your iron and vitamin D supplements as prescribed.  -HISTORY OF ATYPICAL HYPERPLASIA OF BREAST WITH INCREASED BREAST CANCER RISK: Atypical hyperplasia is a condition where breast cells are abnormal and can increase the risk of breast cancer. Continue with your regular mammograms and ultrasounds. Your next mammogram is scheduled for November 12th. Please ensure the results are forwarded to our office.  -GENERALIZED ANXIETY DISORDER: Continue taking Wellbutrin 150 mg daily. A 90-day supply has been prescribed for one year.  -ACUTE SINUSITIS: You have been prescribed doxycycline to take twice daily for 7 days. Use probiotics to prevent yeast infections, continue using fluticasone nasal spray, and take Tylenol  or ibuprofen  for headache and drainage. Aureliano Med sinus rinses and Zyrtec or Claritin for daytime allergy relief are also recommended.

## 2024-04-30 ENCOUNTER — Ambulatory Visit
Admission: RE | Admit: 2024-04-30 | Discharge: 2024-04-30 | Disposition: A | Discharge status: Home / Self Care | Source: Ambulatory Visit | Attending: Obstetrics and Gynecology | Admitting: Obstetrics and Gynecology

## 2024-04-30 DIAGNOSIS — N6310 Unspecified lump in the right breast, unspecified quadrant: Secondary | ICD-10-CM

## 2024-05-21 ENCOUNTER — Encounter (HOSPITAL_BASED_OUTPATIENT_CLINIC_OR_DEPARTMENT_OTHER): Payer: Self-pay | Admitting: Family Medicine

## 2024-05-22 ENCOUNTER — Other Ambulatory Visit (HOSPITAL_BASED_OUTPATIENT_CLINIC_OR_DEPARTMENT_OTHER): Payer: Self-pay | Admitting: Family Medicine

## 2024-05-22 DIAGNOSIS — J4520 Mild intermittent asthma, uncomplicated: Secondary | ICD-10-CM

## 2024-05-22 MED ORDER — ALBUTEROL SULFATE HFA 108 (90 BASE) MCG/ACT IN AERS: 2.0000 | INHALATION_SPRAY | Freq: Four times a day (QID) | RESPIRATORY_TRACT | 5 refills | Status: AC | PRN

## 2024-05-22 NOTE — Telephone Encounter (Signed)
 Please see mychart message sent by pt and advise.

## 2024-06-23 ENCOUNTER — Encounter (HOSPITAL_BASED_OUTPATIENT_CLINIC_OR_DEPARTMENT_OTHER): Admitting: Family Medicine

## 2024-07-07 ENCOUNTER — Encounter (HOSPITAL_BASED_OUTPATIENT_CLINIC_OR_DEPARTMENT_OTHER): Payer: Self-pay | Admitting: Family Medicine

## 2024-07-07 ENCOUNTER — Ambulatory Visit (INDEPENDENT_AMBULATORY_CARE_PROVIDER_SITE_OTHER): Admitting: Family Medicine

## 2024-07-07 VITALS — BP 105/66 | HR 61 | Temp 98.1°F | Resp 18 | Ht 65.5 in | Wt 170.0 lb

## 2024-07-07 DIAGNOSIS — Z0000 Encounter for general adult medical examination without abnormal findings: Secondary | ICD-10-CM

## 2024-07-07 DIAGNOSIS — Z13220 Encounter for screening for lipoid disorders: Secondary | ICD-10-CM | POA: Diagnosis not present

## 2024-07-07 DIAGNOSIS — E559 Vitamin D deficiency, unspecified: Secondary | ICD-10-CM

## 2024-07-07 NOTE — Progress Notes (Signed)
 "  Subjective:   Lindsay Booth 05-Oct-1970  07/07/2024   CC: Chief Complaint  Patient presents with   Annual Exam    HPI: Lindsay Booth is a 54 y.o. female who presents for a routine health maintenance exam.  Labs collected at time of visit. She is doing well and recently completed a half marathon at [employer]. She continues surveillance care with Oncology for history of lymphoma with Dr. Laneta Pump with follow up in March 2025 with [facility].    HEALTH SCREENINGS: - Vision Screening: Wears glasses; UTD - Dental Visits: up to date - Pap smear: UTD with [facility]- will obtain records.  - Breast Exam: Declined - STD Screening: Declined - Mammogram (40+): Up to date  - Colonoscopy (45+): Up to date  - Bone Density (65+ or under 65 with predisposing conditions): Not applicable  - Lung CA screening with low-dose CT:  Not applicable Adults age 39-80 who are current cigarette smokers or quit within the last 15 years. Must have 20 pack year history.   Depression and Anxiety Screen done today and results listed below:     04/22/2024    3:14 PM  Depression screen PHQ 2/9  Decreased Interest 0  Down, Depressed, Hopeless 0  PHQ - 2 Score 0  Altered sleeping 0  Tired, decreased energy 0  Change in appetite 0  Feeling bad or failure about yourself  0  Trouble concentrating 0  Moving slowly or fidgety/restless 0  Suicidal thoughts 0  PHQ-9 Score 0   Difficult doing work/chores Not difficult at all     Data saved with a previous flowsheet row definition      04/22/2024    3:14 PM  GAD 7 : Generalized Anxiety Score  Nervous, Anxious, on Edge 0  Control/stop worrying 0  Worry too much - different things 0  Trouble relaxing 0  Restless 0  Easily annoyed or irritable 0  Afraid - awful might happen 0  Total GAD 7 Score 0  Anxiety Difficulty Not difficult at all    IMMUNIZATIONS: - Tdap: Tetanus vaccination status reviewed: last tetanus booster within 10  years. - HPV: Not applicable - Influenza: Up to date - Prevnar 20: Recommended - Shingrix (50+): Recommended   Past medical history, surgical history, medications, allergies, family history and social history reviewed with patient today and changes made to appropriate areas of the chart.   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   Chemotherapy induced nausea and vomiting 10/17/2023   Depression    Lightheadedness    Lymphoma (HCC)    Obesity    Overactive bladder 04/22/2024   Palpitations    PONV (postoperative nausea and vomiting)    SOB (shortness of breath)    SVT (supraventricular tachycardia)    Tachycardia    Vitamin D  deficiency    Wolff-Parkinson-White (WPW) pattern    Seen by [facility]; asymptomatic and no treatment required    Past Surgical History:  Procedure Laterality Date   BREAST BIOPSY Right 04/19/2020   BREAST EXCISIONAL BIOPSY Right 07/15/2020   PASH   BREAST LUMPECTOMY WITH RADIOACTIVE SEED LOCALIZATION Right 07/15/2020   Procedure: RADIOACTIVE SEED GUIDED RIGHT BREAST LUMPECTOMY;  Surgeon: Vanderbilt Ned, MD;  Location: [facility];  Service: General;  Laterality: Right;   CESAREAN SECTION     TUMOR REMOVAL     fatty tumor  removed from L arm    Current Outpatient Medications on File Prior to Visit  Medication Sig   albuterol  (VENTOLIN  HFA) 108 (90 Base) MCG/ACT inhaler Inhale 2 puffs into the lungs every 6 (six) hours as needed for wheezing or shortness of breath.   buPROPion  (WELLBUTRIN  XL) 150 MG 24 hr tablet Take 1 tablet (150 mg total) by mouth daily.   fluticasone (VERAMYST) 27.5 MCG/SPRAY nasal spray Place 2 sprays into the nose daily.   ibuprofen  (ADVIL ) 800 MG tablet Take 1 tablet (800 mg total) by mouth every 8 (eight) hours as needed.   loratadine (CLARITIN) 10 MG tablet Take 10 mg by mouth daily.   Vitamin D , Ergocalciferol , (DRISDOL) 50000 UNITS CAPS  Take 50,000 Units by mouth.   valACYclovir (VALTREX) 500 MG tablet Take 500 mg by mouth daily.   No current facility-administered medications on file prior to visit.    Allergies[1]   Social History   Socioeconomic History   Marital status: Married    Spouse name: Not on file   Number of children: Not on file   Years of education: Not on file   Highest education level: Not on file  Occupational History   Not on file  Tobacco Use   Smoking status: Never    Passive exposure: Never   Smokeless tobacco: Never  Vaping Use   Vaping status: Never Used  Substance and Sexual Activity   Alcohol use: Yes    Comment: occasionally   Drug use: Never   Sexual activity: Not on file  Other Topics Concern   Not on file  Social History Narrative   Not on file   Social Drivers of Health   Tobacco Use: Low Risk (07/07/2024)   Patient History    Smoking Tobacco Use: Never    Smokeless Tobacco Use: Never    Passive Exposure: Never  Financial Resource Strain: Low Risk (06/11/2023)   Received [facility]   Overall Financial Resource Strain (CARDIA)    Difficulty of Paying Living Expenses: Not very hard  Food Insecurity: No Food Insecurity (06/11/2023)   Received from [facility]   Epic    Within the past 12 months, you worried that your food would run out before you got the money to buy more.: Never true    Within the past 12 months, the food you bought just didn't last and you didn't have money to get more.: Never true  Transportation Needs: No Transportation Needs (06/11/2023)   Received [facility]   PRAPARE - Transportation    Lack of Transportation (Medical): No    Lack of Transportation (Non-Medical): No  Physical Activity: Not on file  Stress: Not on file  Social Connections: Unknown (11/01/2021)   Received from [facility]   Social Network    Social Network: Not on file  Intimate Partner Violence: Unknown (09/23/2021)   Received from [facility]    HITS    Physically Hurt: Not on file    Insult or Talk Down To: Not on file    Threaten Physical Harm: Not on file    Scream or Curse: Not on file  Depression (PHQ2-9): Low Risk (04/22/2024)   Depression (PHQ2-9)    PHQ-2 Score: 0  Alcohol Screen: Not on file  Housing: Low Risk (11/27/2022)   Received from [facility]   Epic    What is your living situation today?: I have a steady place to live    Think about the  place you live. Do you have problems with any of the following? Choose all that apply:: Not on file  Utilities: Low Risk (06/11/2023)   Received from [facility]   Utilities    Within the past 12 months, have you been unable to get utilities(heat, electricity) when it was really needed?: No  Health Literacy: Low Risk (06/11/2023)   Received from [facility] Literacy    How often do you need to have someone help you when you read instructions, pamphlets, or other written material from your doctor or pharmacy?: Never   Tobacco Use History[2] Social History   Substance and Sexual Activity  Alcohol Use Yes   Comment: occasionally    Family History  Problem Relation Age of Onset   Hypertension Father    Breast cancer Paternal Aunt    Breast cancer Paternal Grandmother    Breast cancer Cousin      ROS: Denies fever, fatigue, unexplained weight loss/gain, chest pain, SHOB, and palpitations. Denies neurological deficits, gastrointestinal or genitourinary complaints, and skin changes.   Objective:   Today's Vitals   07/07/24 1044  BP: 105/66  Pulse: 61  Resp: 18  Temp: 98.1 F (36.7 C)  TempSrc: Oral  SpO2: 99%  Weight: 170 lb (77.1 kg)  Height: 5' 5.5 (1.664 m)  PainSc: 0-No pain    GENERAL APPEARANCE: Well-appearing, in NAD. Well nourished.  SKIN: Pink, warm and dry. Turgor normal. No rash, lesion, ulceration, or ecchymoses. Hair evenly distributed.  HEENT: HEAD: Normocephalic.  EYES: PERRLA. EOMI. Lids intact w/o defect. Sclera white,  Conjunctiva pink w/o exudate.  EARS: External ear w/o redness, swelling, masses or lesions. EAC clear. TM's intact, translucent w/o bulging, appropriate landmarks visualized. Appropriate acuity to conversational tones.  NOSE: Septum midline w/o deformity. Nares patent, mucosa pink and non-inflamed w/o drainage. No sinus tenderness.  THROAT: Uvula midline. Oropharynx clear. Tonsils non-inflamed w/o exudate. Oral mucosa pink and moist.  NECK: Supple, Trachea midline. Full ROM w/o pain or tenderness. No lymphadenopathy. Thyroid non-tender w/o enlargement or palpable masses.  RESPIRATORY: Chest wall symmetrical w/o masses. Respirations even and non-labored. Breath sounds clear to auscultation bilaterally. No wheezes, rales, rhonchi, or crackles. CARDIAC: S1, S2 present, regular rate and rhythm. No gallops, murmurs, rubs, or clicks. PMI w/o lifts, heaves, or thrills. No carotid bruits. Capillary refill <2 seconds. Peripheral pulses 2+ bilaterally. GI: Abdomen soft w/o distention. Normoactive bowel sounds. No palpable masses or tenderness. No guarding or rebound tenderness. Liver and spleen w/o tenderness or enlargement. No CVA tenderness.  MSK: Muscle tone and strength appropriate for age, w/o atrophy or abnormal movement.  EXTREMITIES: Active ROM intact, w/o tenderness, crepitus, or contracture. No obvious joint deformities or effusions. No clubbing, edema, or cyanosis.  NEUROLOGIC: CN's II-XII intact. Motor strength symmetrical with no obvious weakness. No sensory deficits. DTR's 2+ symmetric bilaterally. Steady, even gait.  PSYCH/MENTAL STATUS: Alert, oriented x 3. Cooperative, appropriate mood and affect.     Assessment & Plan:  1. Annual physical exam (Primary) Discussed preventative screenings, vaccines, and healthy lifestyle with patient. Labs collected today. Recommend patient obtain Shingrix and Pneumonia vaccines and will discuss with her oncologist in March.  - CBC with  Differential/Platelet - Comprehensive metabolic panel with GFR - TSH  2. Screening for lipid disorders - Lipid panel  3. Vitamin D  deficiency Patient doing well with exercise, calcium and vitamin D  intake with supplementation. Will check Vit D level today.  - VITAMIN D  25 Hydroxy (Vit-D Deficiency,  Fractures)   Orders Placed This Encounter  Procedures   CBC with Differential/Platelet   Comprehensive metabolic panel with GFR   Lipid panel   TSH   VITAMIN D  25 Hydroxy (Vit-D Deficiency, Fractures)    PATIENT COUNSELING:  - Encouraged a healthy well-balanced diet. Patient may adjust caloric intake to maintain or achieve ideal body weight. May reduce intake of dietary saturated fat and total fat and have adequate dietary potassium and calcium preferably from fresh fruits, vegetables, and low-fat dairy products.   - Advised to avoid cigarette smoking. - Discussed with the patient that most people either abstain from alcohol or drink within safe limits (<=14/week and <=4 drinks/occasion for males, <=7/weeks and <= 3 drinks/occasion for females) and that the risk for alcohol disorders and other health effects rises proportionally with the number of drinks per week and how often a drinker exceeds daily limits. - Discussed cessation/primary prevention of drug use and availability of treatment for abuse.  - Discussed sexually transmitted diseases, avoidance of unintended pregnancy and contraceptive alternatives.  - Stressed the importance of regular exercise - Injury prevention: Discussed safety belts, safety helmets, smoke detector, smoking near bedding or upholstery.  - Dental health: Discussed importance of regular tooth brushing, flossing, and dental visits.   NEXT PREVENTATIVE PHYSICAL DUE IN 1 YEAR.  Return in about 1 year (around 07/07/2025) for ANNUAL PHYSICAL.  Patient to reach out to office if new, worrisome, or unresolved symptoms arise or if no improvement in patient's condition.  Patient verbalized understanding and is agreeable to treatment plan. All questions answered to patient's satisfaction.    Thersia Schuyler Stark, FNP     [1] No Known Allergies [2]  Social History Tobacco Use  Smoking Status Never   Passive exposure: Never  Smokeless Tobacco Never   "

## 2024-07-07 NOTE — Patient Instructions (Signed)
 Please discuss Shingrix and Pneumonia vaccines with Oncology    Things to do to keep yourself healthy: - Exercise at least 30-45 minutes a day, 3-4 days a week.  - Eat a low-fat diet with lots of fruits and vegetables, up to 7-9 servings per day.  - Seatbelts can save your life. Wear them always.  - Smoke detectors on every level of your home, check batteries every year.  - Eye Doctor - have an eye exam every 1-2 years  - Safe sex - if you may be exposed to STDs, use a condom.  - No smoking, vaping, or use of any tobacco products.  - Alcohol -  If you drink, do it moderately, less than 2 drinks per day.  - No illegal drug use.  - Depression is common in our stressful world.If you're feeling down or losing interest in things you normally enjoy, please come in for a visit.  - Violence - If anyone is threatening or hurting you, please call immediately.

## 2024-07-08 LAB — CBC WITH DIFFERENTIAL/PLATELET
Basophils Absolute: 0.1 x10E3/uL (ref 0.0–0.2)
Basos: 1 %
EOS (ABSOLUTE): 0.3 x10E3/uL (ref 0.0–0.4)
Eos: 5 %
Hematocrit: 39.1 % (ref 34.0–46.6)
Hemoglobin: 12.8 g/dL (ref 11.1–15.9)
Immature Grans (Abs): 0 x10E3/uL (ref 0.0–0.1)
Immature Granulocytes: 0 %
Lymphocytes Absolute: 0.6 x10E3/uL — ABNORMAL LOW (ref 0.7–3.1)
Lymphs: 11 %
MCH: 27.8 pg (ref 26.6–33.0)
MCHC: 32.7 g/dL (ref 31.5–35.7)
MCV: 85 fL (ref 79–97)
Monocytes Absolute: 0.3 x10E3/uL (ref 0.1–0.9)
Monocytes: 6 %
Neutrophils Absolute: 4.1 x10E3/uL (ref 1.4–7.0)
Neutrophils: 77 %
Platelets: 279 x10E3/uL (ref 150–450)
RBC: 4.61 x10E6/uL (ref 3.77–5.28)
RDW: 13.1 % (ref 11.7–15.4)
WBC: 5.4 x10E3/uL (ref 3.4–10.8)

## 2024-07-08 LAB — COMPREHENSIVE METABOLIC PANEL WITH GFR
ALT: 33 IU/L — ABNORMAL HIGH (ref 0–32)
AST: 36 IU/L (ref 0–40)
Albumin: 4.6 g/dL (ref 3.8–4.9)
Alkaline Phosphatase: 87 IU/L (ref 49–135)
BUN/Creatinine Ratio: 18 (ref 9–23)
BUN: 13 mg/dL (ref 6–24)
Bilirubin Total: 0.3 mg/dL (ref 0.0–1.2)
CO2: 23 mmol/L (ref 20–29)
Calcium: 9.6 mg/dL (ref 8.7–10.2)
Chloride: 105 mmol/L (ref 96–106)
Creatinine, Ser: 0.73 mg/dL (ref 0.57–1.00)
Globulin, Total: 1.6 g/dL (ref 1.5–4.5)
Glucose: 100 mg/dL — ABNORMAL HIGH (ref 70–99)
Potassium: 5.1 mmol/L (ref 3.5–5.2)
Sodium: 142 mmol/L (ref 134–144)
Total Protein: 6.2 g/dL (ref 6.0–8.5)
eGFR: 100 mL/min/1.73

## 2024-07-08 LAB — LIPID PANEL
Chol/HDL Ratio: 4.1 ratio (ref 0.0–4.4)
Cholesterol, Total: 213 mg/dL — ABNORMAL HIGH (ref 100–199)
HDL: 52 mg/dL
LDL Chol Calc (NIH): 129 mg/dL — ABNORMAL HIGH (ref 0–99)
Triglycerides: 180 mg/dL — ABNORMAL HIGH (ref 0–149)
VLDL Cholesterol Cal: 32 mg/dL (ref 5–40)

## 2024-07-08 LAB — TSH: TSH: 2.09 u[IU]/mL (ref 0.450–4.500)

## 2024-07-08 LAB — VITAMIN D 25 HYDROXY (VIT D DEFICIENCY, FRACTURES): Vit D, 25-Hydroxy: 53.6 ng/mL (ref 30.0–100.0)

## 2024-07-09 ENCOUNTER — Other Ambulatory Visit (HOSPITAL_BASED_OUTPATIENT_CLINIC_OR_DEPARTMENT_OTHER): Payer: Self-pay | Admitting: Family Medicine

## 2024-07-09 ENCOUNTER — Ambulatory Visit (HOSPITAL_BASED_OUTPATIENT_CLINIC_OR_DEPARTMENT_OTHER): Payer: Self-pay | Admitting: Family Medicine

## 2024-07-09 DIAGNOSIS — E785 Hyperlipidemia, unspecified: Secondary | ICD-10-CM

## 2024-07-09 NOTE — Progress Notes (Signed)
 Hi Lindsay Booth, Your white blood cell count, hemoglobin and hematocrit are stable.  Your lymphocyte absolute count was slightly low, I am happy to repeat this in 1 to 2 months if you would like or oncology can continue to follow your CBC.  Your electrolytes, kidney and liver function are stable.  Your ALT was just slightly above normal.  Continue a good balanced diet and regular exercise.  Your total cholesterol, triglycerides and LDL are slightly elevated. Were you fasting at time of collection? At this time, I would recommend a heart healthy diet, regular exercise, and rechecking this in 6 months given the significant increase from your cholesterol in 2023 and 2022.  Supplements such as omega-3 fish oil, Garlique, co-Q10 can help with heart health and cholesterol improvement. Your thyroid function is stable and your vitamin D  is excellent. Let me know if you would like a lab appt to repeat the cholesterol and if you have any questions.
